# Patient Record
Sex: Female | Born: 1935 | Race: White | Hispanic: No | Smoking: Former smoker
Health system: Southern US, Community
[De-identification: ages and names within clinical notes are randomized; demographics above are authoritative.]

## PROBLEM LIST (undated history)

## (undated) DIAGNOSIS — E079 Disorder of thyroid, unspecified: Secondary | ICD-10-CM

## (undated) DIAGNOSIS — I10 Essential (primary) hypertension: Secondary | ICD-10-CM

## (undated) HISTORY — DX: Essential (primary) hypertension: I10

## (undated) HISTORY — DX: Disorder of thyroid, unspecified: E07.9

## (undated) HISTORY — PX: TOTAL HIP ARTHROPLASTY: SHX124

## (undated) SURGERY — OPEN REDUCTION INTERNAL FIXATION HIP
Anesthesia: Choice | Laterality: Right

---

## 2010-10-18 DIAGNOSIS — Z8781 Personal history of (healed) traumatic fracture: Secondary | ICD-10-CM | POA: Insufficient documentation

## 2011-08-19 ENCOUNTER — Other Ambulatory Visit: Payer: Self-pay | Admitting: Family Medicine

## 2011-09-14 ENCOUNTER — Other Ambulatory Visit: Payer: Self-pay | Admitting: Family Medicine

## 2011-09-23 ENCOUNTER — Other Ambulatory Visit: Payer: Self-pay | Admitting: Physician Assistant

## 2011-10-08 ENCOUNTER — Ambulatory Visit (INDEPENDENT_AMBULATORY_CARE_PROVIDER_SITE_OTHER): Payer: Medicare Other | Admitting: Family Medicine

## 2011-10-08 ENCOUNTER — Encounter: Payer: Self-pay | Admitting: Family Medicine

## 2011-10-08 VITALS — BP 169/94 | HR 79 | Temp 97.6°F | Resp 16 | Ht 65.0 in | Wt 112.4 lb

## 2011-10-08 DIAGNOSIS — R413 Other amnesia: Secondary | ICD-10-CM | POA: Insufficient documentation

## 2011-10-08 DIAGNOSIS — E039 Hypothyroidism, unspecified: Secondary | ICD-10-CM | POA: Insufficient documentation

## 2011-10-08 DIAGNOSIS — R55 Syncope and collapse: Secondary | ICD-10-CM | POA: Diagnosis not present

## 2011-10-08 DIAGNOSIS — E559 Vitamin D deficiency, unspecified: Secondary | ICD-10-CM | POA: Diagnosis not present

## 2011-10-08 DIAGNOSIS — I951 Orthostatic hypotension: Secondary | ICD-10-CM

## 2011-10-08 DIAGNOSIS — I10 Essential (primary) hypertension: Secondary | ICD-10-CM | POA: Diagnosis not present

## 2011-10-08 DIAGNOSIS — I779 Disorder of arteries and arterioles, unspecified: Secondary | ICD-10-CM

## 2011-10-08 LAB — CBC
HCT: 39.3 % (ref 36.0–46.0)
Hemoglobin: 12.7 g/dL (ref 12.0–15.0)
MCH: 27.9 pg (ref 26.0–34.0)
MCHC: 32.3 g/dL (ref 30.0–36.0)
MCV: 86.2 fL (ref 78.0–100.0)
Platelets: 368 10*3/uL (ref 150–400)
RBC: 4.56 MIL/uL (ref 3.87–5.11)
RDW: 13.8 % (ref 11.5–15.5)
WBC: 7.4 10*3/uL (ref 4.0–10.5)

## 2011-10-08 LAB — COMPREHENSIVE METABOLIC PANEL
ALT: 8 U/L (ref 0–35)
AST: 18 U/L (ref 0–37)
Albumin: 4.5 g/dL (ref 3.5–5.2)
Alkaline Phosphatase: 94 U/L (ref 39–117)
BUN: 24 mg/dL — ABNORMAL HIGH (ref 6–23)
CO2: 28 mEq/L (ref 19–32)
Calcium: 9.5 mg/dL (ref 8.4–10.5)
Chloride: 98 mEq/L (ref 96–112)
Creat: 0.69 mg/dL (ref 0.50–1.10)
Glucose, Bld: 97 mg/dL (ref 70–99)
Potassium: 4.2 mEq/L (ref 3.5–5.3)
Sodium: 136 mEq/L (ref 135–145)
Total Bilirubin: 0.5 mg/dL (ref 0.3–1.2)
Total Protein: 7.4 g/dL (ref 6.0–8.3)

## 2011-10-08 LAB — VITAMIN B12: Vitamin B-12: 755 pg/mL (ref 211–911)

## 2011-10-08 LAB — TSH: TSH: 0.169 u[IU]/mL — ABNORMAL LOW (ref 0.350–4.500)

## 2011-10-08 LAB — T4, FREE: Free T4: 1.35 ng/dL (ref 0.80–1.80)

## 2011-10-08 MED ORDER — HYDROCHLOROTHIAZIDE 12.5 MG PO CAPS: ORAL_CAPSULE | ORAL | Status: DC

## 2011-10-08 MED ORDER — RIVASTIGMINE 9.5 MG/24HR TD PT24: 1.0000 | MEDICATED_PATCH | Freq: Every day | TRANSDERMAL | Status: DC

## 2011-10-08 MED ORDER — LEVOTHYROXINE SODIUM 88 MCG PO TABS: 88.0000 ug | ORAL_TABLET | Freq: Every day | ORAL | Status: DC

## 2011-10-08 NOTE — Progress Notes (Signed)
78 yo hypertensive woman seen with daughter.  Episode of presyncope one month ago.  She was in grocery store and had to be helped to a chair  Using a cane  O:  Alert, appropriate  BP:  Left arm 115/70  Sitting;  Right arm 140/78 sitting;  Right arm 120/80 standing Heart:  Regular, no murmur Chest:  Clear Neck:  No bruits, no JVD Extremity: no edema  45% carotid stenosis noted 09/17/2010 in [place]  A:  Hypertension with marked orthostatic change, carotid artery stenosis.  I am concerned she has very calcified vessels and with small changes of medicine or position, dramatic fluctuations of blood pressure may occur.  She is a very pleasant woman, and her daughter is very caring.  Memory loss, stable  P:  Recheck carotids with doppler CMET, TSH, CBC, vitamin D, Vitamin B12   I've asked her to skip every other day of the HCTZ, stop the vitamin D for now.

## 2011-10-08 NOTE — Patient Instructions (Signed)
Take the hydrochlorothiazide every other day Stop the Vitamin D  We will be getting a carotid doppler study to check on hardening of the arteries.

## 2011-10-09 ENCOUNTER — Other Ambulatory Visit: Payer: Self-pay | Admitting: Family Medicine

## 2011-10-09 DIAGNOSIS — E039 Hypothyroidism, unspecified: Secondary | ICD-10-CM

## 2011-10-09 LAB — VITAMIN D 25 HYDROXY (VIT D DEFICIENCY, FRACTURES): Vit D, 25-Hydroxy: 51 ng/mL (ref 30–89)

## 2011-10-09 MED ORDER — LEVOTHYROXINE SODIUM 50 MCG PO TABS: 50.0000 ug | ORAL_TABLET | Freq: Every day | ORAL | Status: DC

## 2011-10-16 ENCOUNTER — Other Ambulatory Visit: Payer: Self-pay | Admitting: Family Medicine

## 2011-10-16 ENCOUNTER — Telehealth: Payer: Self-pay

## 2011-10-16 DIAGNOSIS — R42 Dizziness and giddiness: Secondary | ICD-10-CM

## 2011-10-16 NOTE — Telephone Encounter (Signed)
Hailey Taylor STATES WE WERE SUPPOSE TO REFER HER MOM TO HAVE A TEST DONE AND THEY HAVEN'T HEARD FROM ANYONE. ALSO SHE WOULD LIKE TO SPEAK WITH DR KURT REGARDING HER MOM'S THYROID. INSTEAD OF UPPING HER DOSAGE, WE LOWERED IT AND SHE DOESN'T UNDERSTAND WHY. PLEASE CALL [phone].  SHE HAD CALLED YESTERDAY AND I THOUGHT FOR SURE I PUT THE MESSAGE IN BUT I DON'T SEE IT.

## 2011-10-16 NOTE — Telephone Encounter (Signed)
I ordered it again.

## 2011-10-16 NOTE — Telephone Encounter (Signed)
Dr L, did you want to make a referral for carotid doppler - see OV notes from last visit?

## 2011-10-17 NOTE — Telephone Encounter (Signed)
PT DAUGHTER WANTED TO KNOW WHY WAS HER SYNTHROID DOSE LOWERED INSTEAD OF INCREASED.

## 2011-10-17 NOTE — Progress Notes (Signed)
DAUGTER NOTIFIED

## 2011-10-21 ENCOUNTER — Ambulatory Visit
Admission: RE | Admit: 2011-10-21 | Discharge: 2011-10-21 | Disposition: A | Discharge status: Home / Self Care | Payer: Medicare Other | Source: Ambulatory Visit | Attending: Family Medicine | Admitting: Family Medicine

## 2011-10-21 DIAGNOSIS — R42 Dizziness and giddiness: Secondary | ICD-10-CM

## 2011-10-21 DIAGNOSIS — I6529 Occlusion and stenosis of unspecified carotid artery: Secondary | ICD-10-CM | POA: Diagnosis not present

## 2011-10-21 DIAGNOSIS — E041 Nontoxic single thyroid nodule: Secondary | ICD-10-CM | POA: Diagnosis not present

## 2011-10-22 NOTE — Telephone Encounter (Signed)
Pt's Daughter Daryll Drown states that yesterday while pt was at [facility] imaging her blood pressures were 208/101 on the right arm and 156/86 on the left arm. She also states that her mother did so much better when she was on the HCTZ everyday instead of every other day.

## 2011-10-23 ENCOUNTER — Telehealth: Payer: Self-pay

## 2011-10-23 ENCOUNTER — Other Ambulatory Visit: Payer: Self-pay | Admitting: Family Medicine

## 2011-10-23 DIAGNOSIS — E041 Nontoxic single thyroid nodule: Secondary | ICD-10-CM

## 2011-10-23 NOTE — Telephone Encounter (Signed)
pts daughter calling to ask some questions about her moms last visit if we could call her

## 2011-10-23 NOTE — Telephone Encounter (Signed)
Spoke with patients daughter and she stated that her bp was running high.  Said that Dr. Elbert Ewings put her on every other day of hctz and she thinks she needs it every day. Can you advise on this.  Daughter has put her on hctz once a day.  If you have any concerns then we can call her back.  She will need a refill if she is going to take it every day.

## 2011-10-25 NOTE — Telephone Encounter (Signed)
Agreed.  Take the HCTZ daily and continue to monitor

## 2011-10-26 ENCOUNTER — Other Ambulatory Visit: Payer: Self-pay | Admitting: Family Medicine

## 2011-10-26 MED ORDER — HYDROCHLOROTHIAZIDE 12.5 MG PO CAPS: 12.5000 mg | ORAL_CAPSULE | Freq: Every day | ORAL | Status: DC

## 2011-10-26 NOTE — Telephone Encounter (Signed)
Notified daughter of Dr Cain Saupe agreement for pt to take HCTZ QD, and sent in new Rx for pt. Answered daughter's ?s about TSH test and medication. Advised her to monitor pt's BP and CB if still running high.

## 2011-10-27 ENCOUNTER — Ambulatory Visit
Admission: RE | Admit: 2011-10-27 | Discharge: 2011-10-27 | Disposition: A | Discharge status: Home / Self Care | Payer: Medicare Other | Source: Ambulatory Visit | Attending: Family Medicine | Admitting: Family Medicine

## 2011-10-27 DIAGNOSIS — E041 Nontoxic single thyroid nodule: Secondary | ICD-10-CM

## 2011-10-27 DIAGNOSIS — E042 Nontoxic multinodular goiter: Secondary | ICD-10-CM | POA: Diagnosis not present

## 2011-10-28 ENCOUNTER — Other Ambulatory Visit: Payer: Self-pay | Admitting: Family Medicine

## 2011-10-28 DIAGNOSIS — E041 Nontoxic single thyroid nodule: Secondary | ICD-10-CM

## 2011-10-29 ENCOUNTER — Telehealth: Payer: Self-pay | Admitting: Family Medicine

## 2011-11-03 ENCOUNTER — Other Ambulatory Visit (HOSPITAL_COMMUNITY)
Admission: RE | Admit: 2011-11-03 | Discharge: 2011-11-03 | Disposition: A | Discharge status: Home / Self Care | Payer: Medicare Other | Source: Ambulatory Visit | Attending: Interventional Radiology | Admitting: Interventional Radiology

## 2011-11-03 ENCOUNTER — Ambulatory Visit
Admission: RE | Admit: 2011-11-03 | Discharge: 2011-11-03 | Disposition: A | Discharge status: Home / Self Care | Payer: Medicare Other | Source: Ambulatory Visit | Attending: Family Medicine | Admitting: Family Medicine

## 2011-11-03 DIAGNOSIS — E041 Nontoxic single thyroid nodule: Secondary | ICD-10-CM

## 2011-11-19 ENCOUNTER — Ambulatory Visit (INDEPENDENT_AMBULATORY_CARE_PROVIDER_SITE_OTHER): Payer: Medicare Other | Admitting: Family Medicine

## 2011-11-19 ENCOUNTER — Encounter: Payer: Self-pay | Admitting: Family Medicine

## 2011-11-19 VITALS — BP 187/90 | HR 79 | Temp 97.1°F | Resp 16 | Ht 65.0 in | Wt 115.0 lb

## 2011-11-19 DIAGNOSIS — I6529 Occlusion and stenosis of unspecified carotid artery: Secondary | ICD-10-CM | POA: Diagnosis not present

## 2011-11-19 DIAGNOSIS — E039 Hypothyroidism, unspecified: Secondary | ICD-10-CM | POA: Diagnosis not present

## 2011-11-19 LAB — TSH: TSH: 4.04 u[IU]/mL (ref 0.350–4.500)

## 2011-11-19 NOTE — Progress Notes (Signed)
Symptomatic: Patient seen with her daughter. She's here for followup of hypothyroidism. We had to decrease her Synthroid because her TSH was very low last time. In addition we're here to followup on her carotid artery stenosis and benign biopsy of her thyroid gland. I discussed the results with patient and her daughter at length pointing out that her carotids are not significantly stenosed, her thyroid is actually cancerous areas, and then we will need to keep an eye on the thyroid because of changing requirements.  Ms. Vitale has been getting elevated blood pressure readings at different doctor's offices. Currently taking HCTZ 12.5 mg daily. She's having no dizzy spells despite the spiking blood pressure of over 200 at times.  Objective:  130/84 equal in each arm Heart:  Reg, I/VI SEM Lungs:  Clear Neck:  No bruit heard.  FINAL for MAIA, HANDA ([id number]) Patient Name: Hailey Taylor, Hailey Taylor Accession #: [id number] DOB: 1921-10-29 Age: 78 Gender: F Client Name [facility] Collected Date: 11/03/2011 Received Date: 11/04/2011 Physician: Irish Lack, MD Chart #: MRN # : [id number] Physician cc: Elvina Sidle Race:W Visit #: [id number].[place]-ACM0 CYTOPATHOLOGY REPORT Adequacy Reason Satisfactory For Evaluation. Diagnosis THYROID, FINE NEEDLE ASPIRATION, LEFT: BENIGN. FINDINGS CONSISTENT WITH A BENIGN FOLLICULAR NODULE. Pecola Leisure MD Pathologist, Electronic Signature (Case signed 11/04/2011) Specimen Clinical Information 1.6 x 0.9 x 0.8 cm solid nodule medially mid left lobe thyroid, Bilateral thyroid nodules on carotid ultrasound Source Thyroid, Fine Needle Aspiration, Left Gross Specimen: Received is/are 30cc's of light peach cytolyt and 6 slides in 95% ethyl alcohol. (MW:ph) Prepared: # Smears: 6 # Concentration Technique Slides (i.e. ThinPrep): 1 # Cell Block: Cell block attempted, but not obtained. Additional Studies: N/A Report signed out from the following  location(s) [facility] [address] [place], [place], [place] [address]. CLIA #: [id number],   *RADIOLOGY REPORT*  Clinical Data: Dizziness  BILATERAL CAROTID DUPLEX ULTRASOUND  Technique: Gray scale imaging, color Doppler and duplex ultrasound  was performed of bilateral carotid and vertebral arteries in the  neck.  Comparison: None.  Criteria: Quantification of carotid stenosis is based on velocity  parameters that correlate the residual internal carotid diameter  with NASCET-based stenosis levels, using the diameter of the distal  internal carotid lumen as the denominator for stenosis measurement.  The following velocity measurements were obtained:  PEAK SYSTOLIC/END DIASTOLIC  RIGHT  ICA: 85cm/sec  CCA: 87cm/sec  SYSTOLIC ICA/CCA RATIO: 0.98  DIASTOLIC ICA/CCA RATIO:  ECA: 02VO/ZDG  LEFT  ICA: 69cm/sec  CCA: 96cm/sec  SYSTOLIC ICA/CCA RATIO: 0.72  DIASTOLIC ICA/CCA RATIO:  ECA: 64QI/HKV  Findings: 1.5 cm right thyroid nodule. Left brachial pressure is  156 mmHg and that of the right is 208 mmHg.  RIGHT CAROTID ARTERY: Moderate calcified plaque in the bulb.  Normal-appearing low resistance internal carotid Doppler wave form.  RIGHT VERTEBRAL ARTERY: Antegrade.  LEFT CAROTID ARTERY: Mild calcified plaque in the left bulb. Low  resistance internal carotid Doppler wave form.  LEFT VERTEBRAL ARTERY: Antegrade.  IMPRESSION:  Less than 50% stenosis within the right and left internal carotid  arteries.  Right thyroid nodule as described. Dedicated thyroid ultrasound is  recommended  Discrepancy in the upper extremity blood pressures as described.  Left upper extremity arterial occlusive disease may be present.  Upper extremity arterial Doppler study may be helpful.  Original Report Authenticated By: Donavan Burnet, M.D.     Assessment: Patient has labile hypertension with normal blood pressures when seated in quiet but high blood pressure spiking when  she gets  agitated or starts moving. Her thyroid seems to be becoming more in line although TSH is pending today to be sure. Finally her carotid artery stenosis is mild and not requiring specific remedy.  Plan: Check TSH today. If normal, followup in 6 months. I expect that she'll be stable on 50 mcg of Synthroid daily she is now taking. Continued HCTZ 12.5 daily with the understanding that blood pressure readings will be high when she moves or gets agitated Several copies of all the ports were made and given to patient, daughter.

## 2011-11-19 NOTE — Patient Instructions (Signed)
FINAL for RAMESHA, POSTER ([id number]) Patient Name: Hailey Taylor, CAFFEY Accession #: [id number] DOB: Jan 11, 1922 Age: 78 Gender: F Client Name [facility] Collected Date: 11/03/2011 Received Date: 11/04/2011 Physician: Irish Lack, MD Chart #: MRN # : [id number] Physician cc: Elvina Sidle Race:W Visit #: [id number].[place]-ACM0 CYTOPATHOLOGY REPORT Adequacy Reason Satisfactory For Evaluation. Diagnosis THYROID, FINE NEEDLE ASPIRATION, LEFT: BENIGN. FINDINGS CONSISTENT WITH A BENIGN FOLLICULAR NODULE. Pecola Leisure MD Pathologist, Electronic Signature (Case signed 11/04/2011) Specimen Clinical Information 1.6 x 0.9 x 0.8 cm solid nodule medially mid left lobe thyroid, Bilateral thyroid nodules on carotid ultrasound Source Thyroid, Fine Needle Aspiration, Left Gross Specimen: Received is/are 30cc's of light peach cytolyt and 6 slides in 95% ethyl alcohol. (MW:ph) Prepared: # Smears: 6 # Concentration Technique Slides (i.e. ThinPrep): 1 # Cell Block: Cell block attempted, but not obtained. Additional Studies: N/A Report signed out from the following location(s) [facility] [address] [place], [place], [place] [address]. CLIA #: [id number],   *RADIOLOGY REPORT*  Clinical Data: Dizziness  BILATERAL CAROTID DUPLEX ULTRASOUND  Technique: Gray scale imaging, color Doppler and duplex ultrasound  was performed of bilateral carotid and vertebral arteries in the  neck.  Comparison: None.  Criteria: Quantification of carotid stenosis is based on velocity  parameters that correlate the residual internal carotid diameter  with NASCET-based stenosis levels, using the diameter of the distal  internal carotid lumen as the denominator for stenosis measurement.  The following velocity measurements were obtained:  PEAK SYSTOLIC/END DIASTOLIC  RIGHT  ICA: 85cm/sec  CCA: 87cm/sec  SYSTOLIC ICA/CCA RATIO: 0.98  DIASTOLIC ICA/CCA RATIO:  ECA: 09NA/TFT  LEFT  ICA: 69cm/sec    CCA: 96cm/sec  SYSTOLIC ICA/CCA RATIO: 0.72  DIASTOLIC ICA/CCA RATIO:  ECA: 73UK/GUR  Findings: 1.5 cm right thyroid nodule. Left brachial pressure is  156 mmHg and that of the right is 208 mmHg.  RIGHT CAROTID ARTERY: Moderate calcified plaque in the bulb.  Normal-appearing low resistance internal carotid Doppler wave form.  RIGHT VERTEBRAL ARTERY: Antegrade.  LEFT CAROTID ARTERY: Mild calcified plaque in the left bulb. Low  resistance internal carotid Doppler wave form.  LEFT VERTEBRAL ARTERY: Antegrade.  IMPRESSION:  Less than 50% stenosis within the right and left internal carotid  arteries.  Right thyroid nodule as described. Dedicated thyroid ultrasound is  recommended  Discrepancy in the upper extremity blood pressures as described.  Left upper extremity arterial occlusive disease may be present.  Upper extremity arterial Doppler study may be helpful.  Original Report Authenticated By: Donavan Burnet, M.D.

## 2012-02-25 ENCOUNTER — Other Ambulatory Visit: Payer: Self-pay | Admitting: Family Medicine

## 2012-04-07 ENCOUNTER — Ambulatory Visit: Payer: Medicare Other | Admitting: Family Medicine

## 2012-04-11 ENCOUNTER — Telehealth: Payer: Self-pay

## 2012-04-11 NOTE — Telephone Encounter (Signed)
Can we get her a HC placard?

## 2012-04-11 NOTE — Telephone Encounter (Signed)
Dr. Milus Glazier - patient is 78 years old and daughter is calling to see if they can get her a handicap placard without bringing her in to the office due to her age and the fact that they do not want her exposed to germs. Please call daughter at [phone] Lillia Pauls) The card they have now expires Oct. 31. It was not issued in our office.

## 2012-04-11 NOTE — Telephone Encounter (Signed)
Will forward to the treating physician as the problems in the problem list do not qualify for handicap placard so perhaps he knows the patient better and can handle this for them.

## 2012-04-13 ENCOUNTER — Telehealth: Payer: Self-pay

## 2012-04-13 NOTE — Telephone Encounter (Signed)
Patient may have handicap sticker for rest of her life.  Please put the application in my box to sign

## 2012-04-13 NOTE — Telephone Encounter (Signed)
Form is completed and let daughter know it is ready for p/u[

## 2012-04-13 NOTE — Telephone Encounter (Signed)
Form is in Dr Cain Saupe box to be completed and signed.

## 2012-05-19 ENCOUNTER — Ambulatory Visit (INDEPENDENT_AMBULATORY_CARE_PROVIDER_SITE_OTHER): Payer: Medicare Other | Admitting: Family Medicine

## 2012-05-19 ENCOUNTER — Encounter: Payer: Self-pay | Admitting: Family Medicine

## 2012-05-19 VITALS — BP 112/74 | HR 75 | Temp 97.7°F | Resp 16 | Ht 65.0 in | Wt 117.7 lb

## 2012-05-19 DIAGNOSIS — R413 Other amnesia: Secondary | ICD-10-CM

## 2012-05-19 DIAGNOSIS — E039 Hypothyroidism, unspecified: Secondary | ICD-10-CM | POA: Diagnosis not present

## 2012-05-19 DIAGNOSIS — I10 Essential (primary) hypertension: Secondary | ICD-10-CM | POA: Diagnosis not present

## 2012-05-19 DIAGNOSIS — Z8781 Personal history of (healed) traumatic fracture: Secondary | ICD-10-CM

## 2012-05-19 DIAGNOSIS — R6889 Other general symptoms and signs: Secondary | ICD-10-CM | POA: Diagnosis not present

## 2012-05-19 MED ORDER — RIVASTIGMINE 9.5 MG/24HR TD PT24: 1.0000 | MEDICATED_PATCH | Freq: Every day | TRANSDERMAL | Status: DC

## 2012-05-19 MED ORDER — LEVOTHYROXINE SODIUM 50 MCG PO TABS: 50.0000 ug | ORAL_TABLET | Freq: Every day | ORAL | Status: DC

## 2012-05-19 MED ORDER — HYDROCHLOROTHIAZIDE 12.5 MG PO CAPS: 12.5000 mg | ORAL_CAPSULE | ORAL | Status: DC

## 2012-05-19 NOTE — Patient Instructions (Signed)
130/60 right arm

## 2012-05-19 NOTE — Progress Notes (Signed)
78 yo with hypertension, early Alzheimer's, and hypothyroidism. No falls No GI sx  Objective:  NAD, cheerful 130/60 right arm Moving 4 extremities symmetrically No bruit heard either carotid Chest:  Clear Heart:  Regular, no murmur, no gallop, no rub Abdomen:  Attends in place, soft nontender, no HSM  Using 4 point cane  Assessment: stable  Plan: continue current meds Recheck 1 year unless problems arise 1. History of fracture of hip    2. Hypothyroid  levothyroxine (SYNTHROID, LEVOTHROID) 50 MCG tablet  3. Hypertension  hydrochlorothiazide (MICROZIDE) 12.5 MG capsule  4. Memory loss  rivastigmine (EXELON) 9.5 mg/24hr

## 2012-05-21 ENCOUNTER — Other Ambulatory Visit: Payer: Self-pay | Admitting: Family Medicine

## 2012-10-17 ENCOUNTER — Other Ambulatory Visit: Payer: Self-pay | Admitting: Family Medicine

## 2012-10-17 ENCOUNTER — Telehealth: Payer: Self-pay

## 2012-10-17 DIAGNOSIS — R413 Other amnesia: Secondary | ICD-10-CM

## 2012-10-17 MED ORDER — RIVASTIGMINE 9.5 MG/24HR TD PT24: 1.0000 | MEDICATED_PATCH | Freq: Every day | TRANSDERMAL | Status: DC

## 2012-10-17 NOTE — Telephone Encounter (Signed)
Patches have been called in

## 2012-10-17 NOTE — Telephone Encounter (Signed)
[name] STATES SHE WAS TOLD HER MOM DIDN'T NEED TO COME BACK IN FOR ANOTHER YEAR, BUT HER EXALON 9.5 MGS PATCHES WERE DENIED BECAUSE SHE NEEDED AN OV AND THEY DON'T UNDERSTAND WHY. PLEASE CALL [phone]   [facility] [address]

## 2012-10-18 NOTE — Telephone Encounter (Signed)
Left message rx sent in. Any questions call.

## 2012-10-25 ENCOUNTER — Other Ambulatory Visit: Payer: Self-pay | Admitting: Family Medicine

## 2013-05-10 ENCOUNTER — Ambulatory Visit (INDEPENDENT_AMBULATORY_CARE_PROVIDER_SITE_OTHER): Payer: Medicare Other | Admitting: Family Medicine

## 2013-05-10 ENCOUNTER — Encounter: Payer: Self-pay | Admitting: Family Medicine

## 2013-05-10 ENCOUNTER — Ambulatory Visit: Payer: Medicare Other | Admitting: Family Medicine

## 2013-05-10 VITALS — BP 104/86 | HR 78 | Temp 97.7°F | Resp 16 | Ht 65.0 in | Wt 113.6 lb

## 2013-05-10 DIAGNOSIS — E039 Hypothyroidism, unspecified: Secondary | ICD-10-CM | POA: Diagnosis not present

## 2013-05-10 DIAGNOSIS — R413 Other amnesia: Secondary | ICD-10-CM | POA: Diagnosis not present

## 2013-05-10 DIAGNOSIS — I10 Essential (primary) hypertension: Secondary | ICD-10-CM

## 2013-05-10 LAB — COMPREHENSIVE METABOLIC PANEL
ALT: 10 U/L (ref 0–35)
AST: 17 U/L (ref 0–37)
Albumin: 4.3 g/dL (ref 3.5–5.2)
Alkaline Phosphatase: 74 U/L (ref 39–117)
BUN: 25 mg/dL — ABNORMAL HIGH (ref 6–23)
CO2: 26 mEq/L (ref 19–32)
Calcium: 10.1 mg/dL (ref 8.4–10.5)
Chloride: 98 mEq/L (ref 96–112)
Creat: 0.75 mg/dL (ref 0.50–1.10)
Glucose, Bld: 94 mg/dL (ref 70–99)
Potassium: 4.1 mEq/L (ref 3.5–5.3)
Sodium: 136 mEq/L (ref 135–145)
Total Bilirubin: 0.6 mg/dL (ref 0.3–1.2)
Total Protein: 7.9 g/dL (ref 6.0–8.3)

## 2013-05-10 LAB — CBC
HCT: 36.9 % (ref 36.0–46.0)
Hemoglobin: 12.6 g/dL (ref 12.0–15.0)
MCH: 27.9 pg (ref 26.0–34.0)
MCHC: 34.1 g/dL (ref 30.0–36.0)
MCV: 81.8 fL (ref 78.0–100.0)
Platelets: 368 10*3/uL (ref 150–400)
RBC: 4.51 MIL/uL (ref 3.87–5.11)
RDW: 15.4 % (ref 11.5–15.5)
WBC: 7.3 10*3/uL (ref 4.0–10.5)

## 2013-05-10 MED ORDER — LEVOTHYROXINE SODIUM 50 MCG PO TABS: 50.0000 ug | ORAL_TABLET | Freq: Every day | ORAL | Status: DC

## 2013-05-10 MED ORDER — HYDROCHLOROTHIAZIDE 12.5 MG PO CAPS: 12.5000 mg | ORAL_CAPSULE | ORAL | Status: DC

## 2013-05-10 MED ORDER — RIVASTIGMINE 9.5 MG/24HR TD PT24: 9.5000 mg | MEDICATED_PATCH | Freq: Every day | TRANSDERMAL | Status: DC

## 2013-05-10 NOTE — Patient Instructions (Signed)
Repeat blood pressure on right arm:  138/74

## 2013-05-10 NOTE — Progress Notes (Signed)
79 yo woman who lives with daughter.  Patient is having some loose stools for the past several weeks.  She does take supplemental potassium some days.  She has well rounded diet  Objective:  NAD, 138/74 right arm Very conversant and appropriate. Chest: clear Mental status:  "Wednesday", "November 25th", "Dr. Milus Glazier" Heart:  Reg, no murmur Neck:  Supple. No bruit  Assessment:  Stable.  No real change from last year  Plan:

## 2013-05-11 LAB — TSH: TSH: 2.918 u[IU]/mL (ref 0.350–4.500)

## 2013-05-11 MED ORDER — LEVOTHYROXINE SODIUM 50 MCG PO TABS: 50.0000 ug | ORAL_TABLET | Freq: Every day | ORAL | Status: DC

## 2013-05-11 NOTE — Addendum Note (Signed)
Addended by: Elvina Sidle on: 05/11/2013 08:03 AM   Modules accepted: Orders

## 2013-09-07 ENCOUNTER — Other Ambulatory Visit: Payer: Self-pay | Admitting: Family Medicine

## 2013-12-24 ENCOUNTER — Other Ambulatory Visit: Payer: Self-pay | Admitting: Family Medicine

## 2014-04-27 ENCOUNTER — Other Ambulatory Visit: Payer: Self-pay | Admitting: Family Medicine

## 2014-05-24 ENCOUNTER — Ambulatory Visit: Payer: Medicare Other | Admitting: Family Medicine

## 2014-06-01 ENCOUNTER — Other Ambulatory Visit: Payer: Self-pay | Admitting: Family Medicine

## 2014-06-02 ENCOUNTER — Other Ambulatory Visit: Payer: Self-pay | Admitting: Family Medicine

## 2014-06-14 ENCOUNTER — Ambulatory Visit: Payer: Medicare Other | Admitting: Family Medicine

## 2014-06-28 ENCOUNTER — Encounter: Payer: Self-pay | Admitting: Family Medicine

## 2014-06-28 ENCOUNTER — Ambulatory Visit (INDEPENDENT_AMBULATORY_CARE_PROVIDER_SITE_OTHER): Payer: Medicare Other | Admitting: Family Medicine

## 2014-06-28 VITALS — BP 196/99 | HR 71 | Temp 97.6°F | Resp 16 | Ht 65.0 in | Wt 112.0 lb

## 2014-06-28 DIAGNOSIS — E039 Hypothyroidism, unspecified: Secondary | ICD-10-CM

## 2014-06-28 DIAGNOSIS — I10 Essential (primary) hypertension: Secondary | ICD-10-CM

## 2014-06-28 DIAGNOSIS — R413 Other amnesia: Secondary | ICD-10-CM

## 2014-06-28 DIAGNOSIS — H6122 Impacted cerumen, left ear: Secondary | ICD-10-CM

## 2014-06-28 MED ORDER — LEVOTHYROXINE SODIUM 50 MCG PO TABS: ORAL_TABLET | ORAL | Status: DC

## 2014-06-28 MED ORDER — LISINOPRIL-HYDROCHLOROTHIAZIDE 20-12.5 MG PO TABS: 1.0000 | ORAL_TABLET | Freq: Every day | ORAL | Status: DC

## 2014-06-28 MED ORDER — RIVASTIGMINE 9.5 MG/24HR TD PT24: 9.5000 mg | MEDICATED_PATCH | Freq: Every day | TRANSDERMAL | Status: DC

## 2014-06-28 NOTE — Progress Notes (Signed)
Subjective:  This chart was scribed for Elvina Sidle, MD by Carl Best, Medical Scribe. This patient was seen in Room 27 and the patient's care was started at 11:41 AM.   Patient ID: Hailey Taylor, female    DOB: 1922-06-13, 81 y.o.   MRN: [id number]  HPI HPI Comments: Hailey Taylor is a 81 y.o. female who presents to the [facility] for a follow-up visit.  She is feeling well but upon exam, her blood pressure on her right arm was significantly higher than the left.  She is no longer experiencing diarrhea.  She drinks a Probiotic drink every morning with relief to her symptoms.  She has some difficulty with her memory.  She has not fallen since her last visit.  She does not get flu vaccines.     Patient Active Problem List   Diagnosis Date Noted  . Hypertension 10/08/2011  . Memory loss 10/08/2011  . Hypothyroidism 10/08/2011  . History of fracture of hip 10/18/2010   No past medical history on file. Past Surgical History  Procedure Laterality Date  . Total hip arthroplasty     Not on File Prior to Admission medications   Medication Sig Start Date End Date Taking? Authorizing Provider  EXELON 9.5 MG/24HR PLACE 1 PATCH (9.5 MG TOTAL) ONTO THE SKIN DAILY. NEEDS OFFICE VISIT FOR MORE    Morrell Riddle, PA-C  GARLIC PO Take by mouth daily.    Historical Provider, MD  hydrochlorothiazide (MICROZIDE) 12.5 MG capsule TAKE 1 CAPSULE BY MOUTH EVERY MORNING. 04/28/14   Chelle S Jeffery, PA-C  levothyroxine (SYNTHROID, LEVOTHROID) 50 MCG tablet TAKE 1 TABLET BY MOUTH DAILY BEFORE BREAKFAST 06/01/14   Morrell Riddle, PA-C  OVER THE COUNTER MEDICATION Vit D3 2000iu    Historical Provider, MD  rivastigmine (EXELON) 9.5 mg/24hr Place 1 patch (9.5 mg total) onto the skin daily. PATIENT NEEDS OFFICE VISIT FOR ADDITIONAL REFILLS 06/04/14   Elvina Sidle, MD   History   Social History  . Marital Status: Divorced    Spouse Name: N/A    Number of Children: N/A  . Years of  Education: N/A   Occupational History  . Not on file.   Social History Main Topics  . Smoking status: Former Games developer  . Smokeless tobacco: Not on file  . Alcohol Use: Yes  . Drug Use: No  . Sexual Activity: Not on file   Other Topics Concern  . Not on file   Social History Narrative  . No narrative on file     Review of Systems  Gastrointestinal: Negative for abdominal pain and diarrhea.  All other systems reviewed and are negative.    Objective:  Physical Exam  Constitutional: She is oriented to person, place, and time. She appears well-developed and well-nourished.  HENT:  Head: Normocephalic and atraumatic.  Right Ear: External ear normal.  Left Ear: External ear normal.  Mouth/Throat: Oropharynx is clear and moist. No oropharyngeal exudate.  Eyes: EOM are normal.  Neck: Normal range of motion. Carotid bruit is not present.  Cardiovascular: Normal rate and regular rhythm.  Exam reveals no gallop and no friction rub.   Murmur heard.  Systolic murmur is present with a grade of 1/6  Pulmonary/Chest: Effort normal and breath sounds normal. No respiratory distress. She has no wheezes. She has no rales.  Abdominal: Soft. Bowel sounds are normal. She exhibits no distension and no mass. There is no tenderness. There is no rebound and  no guarding.  Musculoskeletal: Normal range of motion.  Neurological: She is alert and oriented to person, place, and time.  Unsteady gait and requires the use of a cane.    Skin: Skin is warm and dry.  Psychiatric: She has a normal mood and affect. Her behavior is normal.  Confused about time of year.    Nursing note and vitals reviewed. left ear lavaged clear   BP 196/99 mmHg  Pulse 71  Temp(Src) 97.6 F (36.4 C)  Resp 16  Ht 5\' 5"  (1.651 m)  Wt 112 lb (50.803 kg)  BMI 18.64 kg/m2  SpO2 98% Assessment & Plan:    This chart was scribed in my presence and reviewed by me personally.    ICD-9-CM ICD-10-CM   1. Hypothyroidism,  unspecified hypothyroidism type 244.9 E03.9 TSH     levothyroxine (SYNTHROID, LEVOTHROID) 50 MCG tablet  2. Essential hypertension, benign 401.1 I10 COMPLETE METABOLIC PANEL WITH GFR     lisinopril-hydrochlorothiazide (ZESTORETIC) 20-12.5 MG per tablet  3. Cerumen impaction, left 380.4 H61.22   4. Memory loss 780.93 R41.3 rivastigmine (EXELON) 9.5 mg/24hr     Signed, Elvina SidleKurt Carlye Panameno, MD

## 2014-06-29 LAB — COMPLETE METABOLIC PANEL WITH GFR
ALT: 8 U/L (ref 0–35)
AST: 16 U/L (ref 0–37)
Albumin: 3.9 g/dL (ref 3.5–5.2)
Alkaline Phosphatase: 79 U/L (ref 39–117)
BUN: 24 mg/dL — ABNORMAL HIGH (ref 6–23)
CO2: 29 mEq/L (ref 19–32)
Calcium: 9.5 mg/dL (ref 8.4–10.5)
Chloride: 98 mEq/L (ref 96–112)
Creat: 0.71 mg/dL (ref 0.50–1.10)
GFR, Est African American: 87 mL/min
GFR, Est Non African American: 75 mL/min
Glucose, Bld: 76 mg/dL (ref 70–99)
Potassium: 3.9 mEq/L (ref 3.5–5.3)
Sodium: 137 mEq/L (ref 135–145)
Total Bilirubin: 0.5 mg/dL (ref 0.2–1.2)
Total Protein: 7.2 g/dL (ref 6.0–8.3)

## 2014-06-29 LAB — TSH: TSH: 3.548 u[IU]/mL (ref 0.350–4.500)

## 2014-07-21 ENCOUNTER — Other Ambulatory Visit: Payer: Self-pay | Admitting: Physician Assistant

## 2014-08-28 ENCOUNTER — Other Ambulatory Visit: Payer: Self-pay | Admitting: Physician Assistant

## 2014-11-24 ENCOUNTER — Other Ambulatory Visit: Payer: Self-pay | Admitting: Family Medicine

## 2014-12-10 ENCOUNTER — Other Ambulatory Visit: Payer: Self-pay

## 2014-12-20 ENCOUNTER — Telehealth: Payer: Self-pay

## 2014-12-20 NOTE — Telephone Encounter (Signed)
Patient's daughter is calling because the patients needs a handicap sticker. She will also be dropping paperwork that needs to be filled. [name]'s phone: [phone]

## 2014-12-21 NOTE — Telephone Encounter (Signed)
Patient's daughter Darel HongJudy dropped off form and it has been placed in the box at the nurse's station. She doesn't not have a valid license but will be getting an Rosa Sanchez ID soon.

## 2014-12-25 NOTE — Telephone Encounter (Signed)
Form filled out. Ready for pick up.

## 2014-12-25 NOTE — Telephone Encounter (Signed)
Called daughter to let her know, forms are ready to pick up.

## 2014-12-25 NOTE — Telephone Encounter (Signed)
In Dr. Cain SaupeL's box if someone wanted to help with this.

## 2015-02-15 ENCOUNTER — Telehealth: Payer: Self-pay | Admitting: Family Medicine

## 2015-02-15 NOTE — Telephone Encounter (Signed)
lmom to call and reschedule her appt that she had with Dr Patsy Lager in December

## 2015-03-20 ENCOUNTER — Ambulatory Visit: Payer: Medicare Other | Admitting: Family Medicine

## 2015-04-04 ENCOUNTER — Telehealth: Payer: Self-pay | Admitting: Family Medicine

## 2015-04-04 NOTE — Telephone Encounter (Signed)
Patients daughter returned call. She has appointment with Dr. Patsy Lageropland on 11/30 at 1130

## 2015-04-04 NOTE — Telephone Encounter (Signed)
LEFT A MESSAGE FOR PATIENT TO CALL AND SCHEDULE A VISIT FOR PNEUMONIA VACCINE AND BMI SCREENING AND FOLLOW UP PLAN

## 2015-05-15 ENCOUNTER — Encounter: Payer: Self-pay | Admitting: Family Medicine

## 2015-05-15 ENCOUNTER — Ambulatory Visit (INDEPENDENT_AMBULATORY_CARE_PROVIDER_SITE_OTHER): Payer: Medicare Other | Admitting: Family Medicine

## 2015-05-15 VITALS — BP 106/70 | HR 64 | Temp 97.8°F | Resp 16 | Ht 65.0 in | Wt 100.6 lb

## 2015-05-15 DIAGNOSIS — E034 Atrophy of thyroid (acquired): Secondary | ICD-10-CM

## 2015-05-15 DIAGNOSIS — R748 Abnormal levels of other serum enzymes: Secondary | ICD-10-CM

## 2015-05-15 DIAGNOSIS — E038 Other specified hypothyroidism: Secondary | ICD-10-CM

## 2015-05-15 DIAGNOSIS — I10 Essential (primary) hypertension: Secondary | ICD-10-CM

## 2015-05-15 DIAGNOSIS — R54 Age-related physical debility: Secondary | ICD-10-CM | POA: Diagnosis not present

## 2015-05-15 DIAGNOSIS — R7989 Other specified abnormal findings of blood chemistry: Secondary | ICD-10-CM

## 2015-05-15 LAB — BASIC METABOLIC PANEL
BUN: 47 mg/dL — ABNORMAL HIGH (ref 7–25)
CO2: 24 mmol/L (ref 20–31)
Calcium: 9.3 mg/dL (ref 8.6–10.4)
Chloride: 101 mmol/L (ref 98–110)
Creat: 1.23 mg/dL — ABNORMAL HIGH (ref 0.60–0.88)
GLUCOSE: 78 mg/dL (ref 65–99)
POTASSIUM: 4.7 mmol/L (ref 3.5–5.3)
SODIUM: 134 mmol/L — AB (ref 135–146)

## 2015-05-15 LAB — TSH: TSH: 0.75 u[IU]/mL (ref 0.350–4.500)

## 2015-05-15 MED ORDER — LISINOPRIL-HYDROCHLOROTHIAZIDE 10-12.5 MG PO TABS: 1.0000 | ORAL_TABLET | Freq: Every day | ORAL | Status: DC

## 2015-05-15 MED ORDER — LEVOTHYROXINE SODIUM 50 MCG PO TABS: ORAL_TABLET | ORAL | Status: DC

## 2015-05-15 NOTE — Patient Instructions (Addendum)
Wait until I contact you to fill the thyroid med in case we need to change your dose We decreased your BP medication dose from 20/12.5 to 10/12.5 If your home readings are getting higher than 140/90 let me know  You have dropped some weight- try to add in some higher calorie foods such as ensure, ice cream, milk shakes  You might see Dr. Clelia CroftShaw [facility]  I am also glad to see you at my new office

## 2015-05-15 NOTE — Progress Notes (Addendum)
[facility] [address] [place] [address] [id number] [phone]  Date:  05/15/2015   Name:  Hailey Taylor   DOB:  12/07/1921   MRN:  [id number]  PCP:  No primary care provider on file.    Chief Complaint: Follow-up; Hypertension; and Hypothyroidism   History of Present Illness:  Hailey Taylor is a 81 y.o. very pleasant female patient who presents with the following:  Here today for a follow-up visit.  She had planned to see me now that Dr. Elbert Ewings is retiring- unfortunately I am also leaving [facility] in a few months Needs recheck of her thyroid and BP  Last TSH check in January.   She has been on thyroid medication for several years They added lisinpril to her BP regimen back in January when her BP was quite high.  She fell a couple of times after increasing her BP medication- they changed to using this at night and the falls stopped.  They do not really check her BP at home although they do have a cuff  Also her daughter has noted some weight loss.  Hailey Taylor does eat regularly but does not have a huge appetite.  She suffers from mild alzheimer's disease.  Her daughter Hailey Taylor understands that weight loss is likely part of her aging and chronic disease process and does not wish to pursue any particular testing at this time  Wt Readings from Last 3 Encounters:  05/15/15 100 lb 9.6 oz (45.632 kg)  06/28/14 112 lb (50.803 kg)  05/10/13 113 lb 9.6 oz (51.529 kg)   BP Readings from Last 3 Encounters:  05/15/15 106/70  06/28/14 196/99  05/10/13 104/86      Patient Active Problem List   Diagnosis Date Noted  . Hypertension 10/08/2011  . Memory loss 10/08/2011  . Hypothyroidism 10/08/2011  . History of fracture of hip 10/18/2010    No past medical history on file.  Past Surgical History  Procedure Laterality Date  . Total hip arthroplasty      Social History  Substance Use Topics  . Smoking status: Former Games developer  . Smokeless tobacco: None  . Alcohol Use: Yes     No family history on file.  No Known Allergies  Medication list has been reviewed and updated.  Current Outpatient Prescriptions on File Prior to Visit  Medication Sig Dispense Refill  . levothyroxine (SYNTHROID, LEVOTHROID) 50 MCG tablet TAKE 1 TABLET BY MOUTH DAILY BEFORE BREAKFAST 90 tablet 0  . levothyroxine (SYNTHROID, LEVOTHROID) 50 MCG tablet TAKE 1 TABLET BY MOUTH DAILY BEFORE BREAKFAST 90 tablet 0  . lisinopril-hydrochlorothiazide (ZESTORETIC) 20-12.5 MG per tablet Take 1 tablet by mouth daily. 90 tablet 3  . rivastigmine (EXELON) 9.5 mg/24hr Place 1 patch (9.5 mg total) onto the skin daily. 90 patch 3  . GARLIC PO Take by mouth daily.    Marland Kitchen OVER THE COUNTER MEDICATION Vit D3 2000iu     No current facility-administered medications on file prior to visit.    Review of Systems:  As per HPI- otherwise negative.   Physical Examination: Filed Vitals:   05/15/15 1136  BP: 106/70  Pulse: 64  Temp: 97.8 F (36.6 C)  Resp: 16   Filed Vitals:   05/15/15 1136  Height:  (1.651 m)  Weight: 100 lb 9.6 oz (45.632 kg)   Body mass index is 16.74 kg/(m^2). Ideal Body Weight: Weight in (lb) to have BMI = 25: 149.9  GEN: WDWN, NAD, Non-toxic, A &  O x 3, elderly, very thin lady who appears to be very well kept and cared for.  Nicely dressed and wearing make-up HEENT: Atraumatic, Normocephalic. Neck supple. No masses, No LAD. Ears and Nose: No external deformity. CV: RRR, No M/G/R. No JVD. No thrill. No extra heart sounds. PULM: CTA B, no wheezes, crackles, rhonchi. No retractions. No resp. distress. No accessory muscle use. EXTR: No c/c/e NEURO Normal gait. Does use a cane for balance PSYCH: Normally interactive. Conversant. Not depressed or anxious appearing.  Calm demeanor.    Assessment and Plan: Essential hypertension - Plan: Basic metabolic panel, lisinopril-hydrochlorothiazide (PRINZIDE,ZESTORETIC) 10-12.5 MG tablet  Hypothyroidism due to acquired atrophy of  thyroid - Plan: TSH, levothyroxine (SYNTHROID, LEVOTHROID) 50 MCG tablet  Essential hypertension, benign  Will decrease her BP medication to lisinopril 10/hctz 12.5 as her BP is on the low side and she has had some falls They will monitor her BP and home and keep me posted Check TSH, refilled synthroid Follow-up in 6 months   Signed Abbe AmsterdamJessica Copland, MD  12/1- will call Hailey HongJudy (her daughter) today and let her now TSH is ok, but we do need to recheck her creat in 2-3 weeks.  ?low due to hypotension.  Placed an order for lab visit only

## 2015-05-16 ENCOUNTER — Encounter: Payer: Self-pay | Admitting: Family Medicine

## 2015-05-16 NOTE — Addendum Note (Signed)
Addended by: Abbe AmsterdamOPLAND, JESSICA C on: 05/16/2015 07:13 AM   Modules accepted: Orders

## 2015-05-20 ENCOUNTER — Ambulatory Visit: Payer: Medicare Other | Admitting: Family Medicine

## 2015-05-22 ENCOUNTER — Ambulatory Visit: Payer: Medicare Other | Admitting: Family Medicine

## 2015-05-27 ENCOUNTER — Telehealth: Payer: Self-pay

## 2015-05-27 NOTE — Telephone Encounter (Signed)
Simon RheinGave [name] a call- we will plan to recheck her BMP next month

## 2015-05-27 NOTE — Telephone Encounter (Signed)
Pt daughter is needing to talk with dr copland about pt lab work    [employer] number [phone]

## 2015-05-30 ENCOUNTER — Encounter: Payer: Medicare Other | Admitting: Family Medicine

## 2015-06-01 ENCOUNTER — Other Ambulatory Visit: Payer: Self-pay | Admitting: Family Medicine

## 2015-06-15 ENCOUNTER — Other Ambulatory Visit: Payer: Self-pay | Admitting: Family Medicine

## 2015-07-03 ENCOUNTER — Other Ambulatory Visit: Payer: Self-pay | Admitting: Family Medicine

## 2015-07-03 ENCOUNTER — Other Ambulatory Visit (INDEPENDENT_AMBULATORY_CARE_PROVIDER_SITE_OTHER): Payer: Medicare Other

## 2015-07-03 DIAGNOSIS — R748 Abnormal levels of other serum enzymes: Secondary | ICD-10-CM

## 2015-07-03 DIAGNOSIS — R7989 Other specified abnormal findings of blood chemistry: Secondary | ICD-10-CM

## 2015-07-03 LAB — BASIC METABOLIC PANEL
BUN: 34 mg/dL — ABNORMAL HIGH (ref 7–25)
CO2: 27 mmol/L (ref 20–31)
Calcium: 9.9 mg/dL (ref 8.6–10.4)
Chloride: 98 mmol/L (ref 98–110)
Creat: 1.25 mg/dL — ABNORMAL HIGH (ref 0.60–0.88)
GLUCOSE: 72 mg/dL (ref 65–99)
POTASSIUM: 4.5 mmol/L (ref 3.5–5.3)
SODIUM: 135 mmol/L (ref 135–146)

## 2015-07-04 ENCOUNTER — Telehealth: Payer: Self-pay

## 2015-07-04 ENCOUNTER — Encounter: Payer: Self-pay | Admitting: Family Medicine

## 2015-07-04 NOTE — Telephone Encounter (Signed)
Called [name] back and [facility]- I received message that Hailey Taylor was not doing well.  Please bring her in so we can make sure there is no phyiscal cause such as UTI - maybe this Saturday?

## 2015-07-04 NOTE — Telephone Encounter (Signed)
Dr Patsy Lager  Condition worse - talking about things that don't exist.  Daughter has been able to handle up to this point.  Looking for a medication - anti anxiety - stop worrying.   Everything is fine - worry.    [phone]

## 2015-07-10 ENCOUNTER — Other Ambulatory Visit: Payer: Self-pay | Admitting: *Deleted

## 2015-07-10 ENCOUNTER — Ambulatory Visit (INDEPENDENT_AMBULATORY_CARE_PROVIDER_SITE_OTHER): Payer: Medicare Other | Admitting: Family Medicine

## 2015-07-10 VITALS — BP 80/57 | HR 96 | Temp 97.7°F | Resp 16 | Ht 65.0 in | Wt 100.0 lb

## 2015-07-10 DIAGNOSIS — F0280 Dementia in other diseases classified elsewhere without behavioral disturbance: Secondary | ICD-10-CM | POA: Diagnosis not present

## 2015-07-10 DIAGNOSIS — F05 Delirium due to known physiological condition: Principal | ICD-10-CM

## 2015-07-10 DIAGNOSIS — R413 Other amnesia: Secondary | ICD-10-CM | POA: Diagnosis not present

## 2015-07-10 DIAGNOSIS — R410 Disorientation, unspecified: Secondary | ICD-10-CM

## 2015-07-10 DIAGNOSIS — N3000 Acute cystitis without hematuria: Secondary | ICD-10-CM

## 2015-07-10 DIAGNOSIS — G301 Alzheimer's disease with late onset: Secondary | ICD-10-CM | POA: Diagnosis not present

## 2015-07-10 DIAGNOSIS — R350 Frequency of micturition: Secondary | ICD-10-CM

## 2015-07-10 MED ORDER — LISINOPRIL 10 MG PO TABS: 10.0000 mg | ORAL_TABLET | Freq: Every day | ORAL | Status: DC

## 2015-07-10 NOTE — Patient Instructions (Addendum)
We are going to cut down on the blood pressure medication. We will stop HCTZ and use ONLY the lisinopril 10 mg. I will also culture the urine to make sure there is no sign of infection.  If her BP continues to run low we will stop the BP medication entirely  Remember to take care of yourself- it is time to get some more help for you!  This may include taking advantage of day programs for seniors and/ or getting some help at home to provide respite for yourself.    Please check out the website for [facility] senior resources. Also, you can read reviews and otherwise investigate home care agencies online.  [facility] has reviews of many agencies.  [email or web address].[email or web address]-resources-[place].org/ https://www.caring.com/local/[email or web address][place][email or web address][email or web address]

## 2015-07-10 NOTE — Progress Notes (Addendum)
[facility] [address] [place] [address] [phone]  Date:  07/10/2015   Name:  Hailey Taylor   DOB:  Aug 26, 1921   MRN:  [id number]  PCP:  No primary care provider on file.    Chief Complaint: Memory Loss   History of Present Illness:  Hailey Taylor is a 82 y.o. very pleasant female patient who presents with the following:  Here today with her daughter and caregiver Hailey Taylor- she has noticed more confusion as of late.   They brought in a urine sample from last night as I had mentioned possible UTI as the cause of worsening.    Hailey Taylor is a lot more confused over the last several months.  She has been living with her daughter for the last 5 years or so. Her daughter is getting more concerned about her and also about her ability to care for her on her own They have not had any problems with wandering, trying to cook, etc.   No more falls recently   She has been on the exelon patch for several years.  She has been formally dx with Alzhemier's disease by her neurologoist when she was living in [facility] They decline a flu shot today At our last visit we decreased her anti-HTN med dosage   BP Readings from Last 3 Encounters:  07/10/15 80/57  05/15/15 106/70  06/28/14 196/99     Patient Active Problem List   Diagnosis Date Noted  . 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 Hypertension 10/08/2011  . Memory loss 10/08/2011  . Hypothyroidism 10/08/2011  . History of fracture of hip 10/18/2010    No past medical history on file.  Past Surgical History  Procedure Laterality Date  . Total hip arthroplasty      Social History  Substance Use Topics  . Smoking status: Former Games developer  . Smokeless tobacco: Not on file  . Alcohol Use: Yes    No family history on file.  No Known Allergies  Medication list has been reviewed and updated.  Current Outpatient Prescriptions on File Prior to Visit  Medication Sig Dispense Refill  . aspirin 81 MG tablet Take 81 mg by  mouth daily.    Marland Kitchen GARLIC PO Take by mouth daily.    Marland Kitchen levothyroxine (SYNTHROID, LEVOTHROID) 50 MCG tablet TAKE 1 TABLET BY MOUTH DAILY BEFORE BREAKFAST 90 tablet 3  . lisinopril-hydrochlorothiazide (PRINZIDE,ZESTORETIC) 10-12.5 MG tablet Take 1 tablet by mouth daily. 90 tablet 3  . OVER THE COUNTER MEDICATION Vit D3 2000iu    . rivastigmine (EXELON) 9.5 mg/24hr PLACE 1 PATCH (9.5 MG TOTAL) ONTO THE SKIN DAILY 90 patch 0   No current facility-administered medications on file prior to visit.    Review of Systems:  As per HPI- otherwise negative.   Physical Examination: Filed Vitals:   07/10/15 1154 07/10/15 1155  BP: 132/86 80/57  Pulse: 89 96  Temp: 97.7 F (36.5 C)   Resp: 16    Filed Vitals:   07/10/15 1154  Height:  (1.651 m)  Weight: 100 lb (45.36 kg)   Body mass index is 16.64 kg/(m^2). Ideal Body Weight: Weight in (lb) to have BMI = 25: 149.9  GEN: WDWN, NAD, confused, well groomed and dressed HEENT: Atraumatic, Normocephalic. Neck supple. No masses, No LAD. Ears and Nose: No external deformity. CV: RRR, No M/G/R. No JVD. No thrill. No extra heart sounds. PULM: CTA B, no wheezes, crackles, rhonchi. No retractions. No resp. distress.  No accessory muscle use. EXTR: No c/c/e NEURO Normal gait for pt, uses a walker PSYCH: cannot generally contribute to conversation- does make a few sensical comments but mostly says things like "my, my," etc. Not depressed or anxious appearing.  Calm demeanor.    Assessment and Plan: Subacute confusional state - Plan: Urine culture, lisinopril (PRINIVIL,ZESTRIL) 10 MG tablet  Memory loss  Urinary frequency - Plan: Urine culture  Late onset Alzheimer's disease without behavioral disturbance  At this point Hailey Taylor is having more trouble taking care of her mom at home without really any help. Discussed some resources that may be useful for her and encouraged her to ask for help.  She plans to do so Her creat has been a little bit  high, and her BP is on the low side.  Will stop HCTZ Will culture urine to make sure no UTI Encouraged use of routines to aid in memory management Will plan further follow- up pending labs.   Signed Abbe Amsterdam, MD  1/28- received positive urine culture and gave Hailey Taylor a call.  [name] does have a UTI.  Will send in abx now.   Meds ordered this encounter  Medications  . DISCONTD: lisinopril (PRINIVIL,ZESTRIL) 10 MG tablet    Sig: Take 1 tablet (10 mg total) by mouth daily.    Dispense:  30 tablet    Refill:  11  . amoxicillin-clavulanate (AUGMENTIN) 500-125 MG tablet    Sig: Take 1 tablet (500 mg total) by mouth 2 (two) times daily.    Dispense:  14 tablet    Refill:  0  '

## 2015-07-12 LAB — URINE CULTURE

## 2015-07-13 ENCOUNTER — Encounter: Payer: Self-pay | Admitting: Family Medicine

## 2015-07-13 DIAGNOSIS — R7989 Other specified abnormal findings of blood chemistry: Secondary | ICD-10-CM

## 2015-07-13 MED ORDER — AMOXICILLIN-POT CLAVULANATE 500-125 MG PO TABS: 1.0000 | ORAL_TABLET | Freq: Two times a day (BID) | ORAL | Status: DC

## 2015-07-13 NOTE — Addendum Note (Signed)
Addended by: Abbe Amsterdam C on: 07/13/2015 11:55 AM   Modules accepted: Orders

## 2015-09-06 ENCOUNTER — Other Ambulatory Visit (INDEPENDENT_AMBULATORY_CARE_PROVIDER_SITE_OTHER): Payer: Medicare Other | Admitting: *Deleted

## 2015-09-06 DIAGNOSIS — R7989 Other specified abnormal findings of blood chemistry: Secondary | ICD-10-CM | POA: Diagnosis not present

## 2015-09-06 DIAGNOSIS — R748 Abnormal levels of other serum enzymes: Secondary | ICD-10-CM | POA: Diagnosis not present

## 2015-09-07 LAB — BASIC METABOLIC PANEL
BUN: 38 mg/dL — AB (ref 7–25)
CO2: 22 mmol/L (ref 20–31)
Calcium: 9.3 mg/dL (ref 8.6–10.4)
Chloride: 100 mmol/L (ref 98–110)
Creat: 1.26 mg/dL — ABNORMAL HIGH (ref 0.60–0.88)
Glucose, Bld: 76 mg/dL (ref 65–99)
POTASSIUM: 4.8 mmol/L (ref 3.5–5.3)
SODIUM: 136 mmol/L (ref 135–146)

## 2015-10-09 ENCOUNTER — Other Ambulatory Visit: Payer: Self-pay | Admitting: Family Medicine

## 2015-11-07 ENCOUNTER — Encounter: Payer: Self-pay | Admitting: Family Medicine

## 2015-11-07 ENCOUNTER — Ambulatory Visit (INDEPENDENT_AMBULATORY_CARE_PROVIDER_SITE_OTHER): Payer: Medicare Other | Admitting: Family Medicine

## 2015-11-07 VITALS — BP 117/76 | HR 62 | Temp 97.9°F | Resp 20 | Wt 100.5 lb

## 2015-11-07 DIAGNOSIS — E039 Hypothyroidism, unspecified: Secondary | ICD-10-CM

## 2015-11-07 DIAGNOSIS — N289 Disorder of kidney and ureter, unspecified: Secondary | ICD-10-CM | POA: Diagnosis not present

## 2015-11-07 DIAGNOSIS — I10 Essential (primary) hypertension: Secondary | ICD-10-CM

## 2015-11-07 DIAGNOSIS — N9089 Other specified noninflammatory disorders of vulva and perineum: Secondary | ICD-10-CM | POA: Diagnosis not present

## 2015-11-07 DIAGNOSIS — G301 Alzheimer's disease with late onset: Secondary | ICD-10-CM

## 2015-11-07 DIAGNOSIS — F0280 Dementia in other diseases classified elsewhere without behavioral disturbance: Secondary | ICD-10-CM

## 2015-11-07 LAB — POCT URINALYSIS DIP (MANUAL ENTRY)
BILIRUBIN UA: NEGATIVE
GLUCOSE UA: NEGATIVE
Ketones, POC UA: NEGATIVE
Nitrite, UA: POSITIVE — AB
Protein Ur, POC: 30 — AB
SPEC GRAV UA: 1.02
UROBILINOGEN UA: 0.2
pH, UA: 5.5

## 2015-11-07 LAB — POC MICROSCOPIC URINALYSIS (UMFC): Mucus: ABSENT

## 2015-11-07 LAB — BASIC METABOLIC PANEL
BUN: 32 mg/dL — AB (ref 7–25)
CALCIUM: 9.3 mg/dL (ref 8.6–10.4)
CO2: 23 mmol/L (ref 20–31)
Chloride: 102 mmol/L (ref 98–110)
Creat: 1.06 mg/dL — ABNORMAL HIGH (ref 0.60–0.88)
Glucose, Bld: 80 mg/dL (ref 65–99)
POTASSIUM: 4.2 mmol/L (ref 3.5–5.3)
Sodium: 137 mmol/L (ref 135–146)

## 2015-11-07 LAB — TSH: TSH: 1.23 mIU/L

## 2015-11-07 MED ORDER — HYDROCHLOROTHIAZIDE 12.5 MG PO CAPS: 12.5000 mg | ORAL_CAPSULE | Freq: Every day | ORAL | Status: DC

## 2015-11-07 MED ORDER — MUPIROCIN 2 % EX OINT: 1.0000 "application " | TOPICAL_OINTMENT | Freq: Four times a day (QID) | CUTANEOUS | Status: DC

## 2015-11-07 NOTE — Patient Instructions (Addendum)
Meds ordered this encounter  Medications  . Cholecalciferol (VITAMIN D3) 5000 units CAPS    Sig: Take 1 capsule by mouth daily.  . mupirocin ointment (BACTROBAN) 2 %    Sig: Apply 1 application topically 4 (four) times daily.    Dispense:  30 g    Refill:  1  . hydrochlorothiazide (MICROZIDE) 12.5 MG capsule    Sig: Take 1 capsule (12.5 mg total) by mouth daily.    Dispense:  30 capsule    Refill:  1   Stop the lisinopril. Restart the prior hydrochlorothiazide (HCTZ). Ok to continue the potassium supplement for now but be aware that I will likely encourage you to stop this in the fall.  Apply the mupirocin and a non-stick pad to the left inner upper thigh ulcer 2-4 times a day after a wet warm washcloth (consider wrapping the washcloth around a spoon held under the hot water to keep it warm for longer.  This will help that chronic ulcer/wound - likely from an ingrown hair - heal.  If you are worried about any urinary symtoms, you may always drop off a urinary sample at your convenience as long as it has the patient's name and DOB on it  As well as my name "Dr. Clelia CroftShaw" so they know who to send the results to.  Managing Your High Blood Pressure Blood pressure is a measurement of how forceful your blood is pressing against the walls of the arteries. Arteries are muscular tubes within the circulatory system. Blood pressure does not stay the same. Blood pressure rises when you are active, excited, or nervous; and it lowers during sleep and relaxation. If the numbers measuring your blood pressure stay above normal most of the time, you are at risk for health problems. High blood pressure (hypertension) is a long-term (chronic) condition in which blood pressure is elevated. A blood pressure reading is recorded as two numbers, such as 120 over 80 (or 120/80). The first, higher number is called the systolic pressure. It is a measure of the pressure in your arteries as the heart beats. The second, lower  number is called the diastolic pressure. It is a measure of the pressure in your arteries as the heart relaxes between beats.  Keeping your blood pressure in a normal range is important to your overall health and prevention of health problems, such as heart disease and stroke. When your blood pressure is uncontrolled, your heart has to work harder than normal. High blood pressure is a very common condition in adults because blood pressure tends to rise with age. Men and women are equally likely to have hypertension but at different times in life. Before age 80, men are more likely to have hypertension. After 80 years of age, women are more likely to have it. Hypertension is especially common in African Americans. This condition often has no signs or symptoms. The cause of the condition is usually not known. Your caregiver can help you come up with a plan to keep your blood pressure in a normal, healthy range. BLOOD PRESSURE STAGES Blood pressure is classified into four stages: normal, prehypertension, stage 1, and stage 2. Your blood pressure reading will be used to determine what type of treatment, if any, is necessary. Appropriate treatment options are tied to these four stages:  Normal  Systolic pressure (mm Hg): below 120.  Diastolic pressure (mm Hg): below 80. Prehypertension  Systolic pressure (mm Hg): 120 to 139.  Diastolic pressure (mm Hg): 80 to 89.  Stage1  Systolic pressure (mm Hg): 140 to 159.  Diastolic pressure (mm Hg): 90 to 99. Stage2  Systolic pressure (mm Hg): 160 or above.  Diastolic pressure (mm Hg): 100 or above. RISKS RELATED TO HIGH BLOOD PRESSURE Managing your blood pressure is an important responsibility. Uncontrolled high blood pressure can lead to:  A heart attack.  A stroke.  A weakened blood vessel (aneurysm).  Heart failure.  Kidney damage.  Eye damage.  Metabolic syndrome.  Memory and concentration problems. HOW TO MANAGE YOUR BLOOD  PRESSURE Blood pressure can be managed effectively with lifestyle changes and medicines (if needed). Your caregiver will help you come up with a plan to bring your blood pressure within a normal range. Your plan should include the following: Education  Read all information provided by your caregivers about how to control blood pressure.  Educate yourself on the latest guidelines and treatment recommendations. New research is always being done to further define the risks and treatments for high blood pressure. Lifestylechanges  Control your weight.  Avoid smoking.  Stay physically active.  Reduce the amount of salt in your diet.  Reduce stress.  Control any chronic conditions, such as high cholesterol or diabetes.  Reduce your alcohol intake. Medicines  Several medicines (antihypertensive medicines) are available, if needed, to bring blood pressure within a normal range. Communication  Review all the medicines you take with your caregiver because there may be side effects or interactions.  Talk with your caregiver about your diet, exercise habits, and other lifestyle factors that may be contributing to high blood pressure.  See your caregiver regularly. Your caregiver can help you create and adjust your plan for managing high blood pressure. RECOMMENDATIONS FOR TREATMENT AND FOLLOW-UP  The following recommendations are based on current guidelines for managing high blood pressure in nonpregnant adults. Use these recommendations to identify the proper follow-up period or treatment option based on your blood pressure reading. You can discuss these options with your caregiver.  Systolic pressure of 120 to 139 or diastolic pressure of 80 to 89: Follow up with your caregiver as directed.  Systolic pressure of 140 to 160 or diastolic pressure of 90 to 100: Follow up with your caregiver within 2 months.  Systolic pressure above 160 or diastolic pressure above 100: Follow up with your  caregiver within 1 month.  Systolic pressure above 180 or diastolic pressure above 110: Consider antihypertensive therapy; follow up with your caregiver within 1 week.  Systolic pressure above 200 or diastolic pressure above 120: Begin antihypertensive therapy; follow up with your caregiver within 1 week.   This information is not intended to replace advice given to you by your health care provider. Make sure you discuss any questions you have with your health care provider.   Document Released: 02/24/2012 Document Reviewed: 02/24/2012 Elsevier Interactive Patient Education [employer]

## 2015-11-07 NOTE — Progress Notes (Addendum)
Subjective:    Patient ID: Hailey Taylor, female    DOB: 09/29/1921, 82 y.o.   MRN: [id number] Chief Complaint  Patient presents with  . Follow-up    follow up of thyroid and hypertension    HPI  Hailey Taylor is a 82 yo pt who is new to me. She was previously being followed by my colleague Dr. Milus GlazierLauenstein and Dr. Patsy Lageropland.  She was last seen 4 mos prior for a UTI worsening her Alzheimer's sxs to a mild delerium. This is her 6 month follow -up for her chronic medical problems.  Hypothyroid: On levothyroxine for several years - dose stable for over 4 yrs but on the low end since the weight loss.  HTN: lisinopril was started 1 1/2 yrs ago - takes at night as wonders if it iwas causing day-time falls.  Do not check BP at home though they do have a cuff. BP med was decreased from 20-12.5 to 10-12.5 at last visit.  However, then 5 months ago - every since the lsinoril her renal function sig worsened from 0.7 rto 1.2.  THere is a distinct difference - the right arm is always much higher for over 4-5 years. She had a left upper extremity arterial occlusive disease  May be present and rec an upper extremmieyt artherial doppler  Alzheimer's - mild, appetite decreasing to so weight loss at last visit. Has been steady since last visit as she has started Ensure with the meal - takes on average a bottle a day over 3 different times.  Has been giving her mother a 99mg  of otc K supp daily.  From [place] - moves here in 2011.  Past Medical History  Diagnosis Date  . Thyroid disease   . Hypertension    Past Surgical History  Procedure Laterality Date  . Total hip arthroplasty     Current Outpatient Prescriptions on File Prior to Visit  Medication Sig Dispense Refill  . aspirin 81 MG tablet Take 81 mg by mouth daily.    . EXELON 9.5 MG/24HR PLACE 1 PATCH (9.5 MG TOTAL) ONTO THE SKIN DAILY 90 patch 0  . levothyroxine (SYNTHROID, LEVOTHROID) 50 MCG tablet TAKE 1 TABLET BY MOUTH DAILY BEFORE BREAKFAST 90  tablet 3   No current facility-administered medications on file prior to visit.   No Known Allergies History reviewed. No pertinent family history. Social History   Social History  . Marital Status: Divorced    Spouse Name: N/A  . Number of Children: N/A  . Years of Education: N/A   Social History Main Topics  . Smoking status: Former Games developermoker  . Smokeless tobacco: None  . Alcohol Use: 0.0 oz/week    0 Standard drinks or equivalent per week  . Drug Use: No  . Sexual Activity: Not Asked   Other Topics Concern  . None   Social History Narrative     Review of Systems  Constitutional: Positive for appetite change. Negative for fever, chills, activity change and unexpected weight change.  Respiratory: Negative for shortness of breath.   Cardiovascular: Negative for chest pain and leg swelling.  Gastrointestinal: Negative for nausea, vomiting, abdominal pain, diarrhea and constipation.  Genitourinary: Positive for frequency and enuresis. Negative for dysuria, flank pain and difficulty urinating.  Allergic/Immunologic: Negative for immunocompromised state.  Neurological: Positive for weakness. Negative for dizziness, light-headedness and numbness.  Psychiatric/Behavioral: Positive for confusion and decreased concentration. Negative for behavioral problems, sleep disturbance, dysphoric mood and agitation. The patient is not nervous/anxious.  Objective:  BP 117/76 mmHg  Pulse 62  Temp(Src) 97.9 F (36.6 C) (Oral)  Resp 20  Wt 100 lb 8 oz (45.587 kg)  SpO2 97%  Physical Exam  Constitutional: She appears well-developed. She appears cachectic. No distress.  HENT:  Head: Normocephalic and atraumatic.  Right Ear: External ear normal.  Left Ear: External ear normal.  Eyes: Conjunctivae are normal. No scleral icterus.  Neck: Normal range of motion. Neck supple. No thyromegaly present.  Cardiovascular: Normal rate, regular rhythm, normal heart sounds and intact distal  pulses.   Pulmonary/Chest: Effort normal and breath sounds normal. No respiratory distress.  Genitourinary:    There is no rash, tenderness or lesion on the right labia. There is no rash, tenderness or lesion on the left labia.  erythematous nodule with central 1-2 mm ulceration, no drainage, + tenderness, no fluctuance, minimal superficial inderation  Musculoskeletal: She exhibits no edema.  Lymphadenopathy:    She has no cervical adenopathy.  Neurological: She is alert. She is disoriented.  Skin: Skin is warm and dry. She is not diaphoretic. No erythema.  Psychiatric: She has a normal mood and affect. Her behavior is normal.            Assessment & Plan:   1. Essential hypertension   2. Hypothyroidism, unspecified hypothyroidism type   3. Female perineal bleeding - warm wet compress with top mupirocin - suspect ingrown hair irritation  4. Late onset Alzheimer's disease without behavioral disturbance   5. Decreased renal function - since starting lisinopril - trial off then recheck Cr in sev mos.    Orders Placed This Encounter  Procedures  . Urine culture    Standing Status: Future     Number of Occurrences:      Standing Expiration Date: 11/06/2016  . Urine culture  . WOUND CULTURE ([facility] ONLY)    Order Specific Question:  Source    Answer:  left inner upper thigh  . TSH  . Basic metabolic panel    Order Specific Question:  Has the patient fasted?    Answer:  No  . POCT Microscopic Urinalysis ([facility])    Standing Status: Future     Number of Occurrences:      Standing Expiration Date: 01/06/2017  . POCT urinalysis dipstick    Standing Status: Future     Number of Occurrences:      Standing Expiration Date: 11/06/2016  . POCT urinalysis dipstick  . POCT Microscopic Urinalysis ([facility])    Meds ordered this encounter  Medications  . Cholecalciferol (VITAMIN D3) 5000 units CAPS    Sig: Take 1 capsule by mouth daily.  . mupirocin ointment (BACTROBAN) 2 %    Sig:  Apply 1 application topically 4 (four) times daily.    Dispense:  30 g    Refill:  1  . hydrochlorothiazide (MICROZIDE) 12.5 MG capsule    Sig: Take 1 capsule (12.5 mg total) by mouth daily.    Dispense:  30 capsule    Refill:  1    Norberto Sorenson, MD MPH

## 2015-11-10 LAB — URINE CULTURE

## 2015-11-12 LAB — WOUND CULTURE: GRAM STAIN: NONE SEEN

## 2015-11-25 ENCOUNTER — Encounter: Payer: Self-pay | Admitting: Family Medicine

## 2015-11-25 DIAGNOSIS — I129 Hypertensive chronic kidney disease with stage 1 through stage 4 chronic kidney disease, or unspecified chronic kidney disease: Secondary | ICD-10-CM | POA: Insufficient documentation

## 2015-11-25 DIAGNOSIS — N3942 Incontinence without sensory awareness: Secondary | ICD-10-CM | POA: Insufficient documentation

## 2015-11-25 DIAGNOSIS — N183 Chronic kidney disease, stage 3 (moderate): Secondary | ICD-10-CM

## 2015-11-25 DIAGNOSIS — E860 Dehydration: Secondary | ICD-10-CM | POA: Insufficient documentation

## 2015-12-31 ENCOUNTER — Other Ambulatory Visit: Payer: Self-pay | Admitting: Family Medicine

## 2016-01-09 ENCOUNTER — Ambulatory Visit (INDEPENDENT_AMBULATORY_CARE_PROVIDER_SITE_OTHER): Payer: Medicare Other | Admitting: Family Medicine

## 2016-01-09 VITALS — BP 138/80 | HR 71 | Temp 97.5°F | Resp 18 | Ht 68.0 in | Wt 106.6 lb

## 2016-01-09 DIAGNOSIS — F0280 Dementia in other diseases classified elsewhere without behavioral disturbance: Secondary | ICD-10-CM

## 2016-01-09 DIAGNOSIS — G301 Alzheimer's disease with late onset: Secondary | ICD-10-CM

## 2016-01-09 DIAGNOSIS — I10 Essential (primary) hypertension: Secondary | ICD-10-CM

## 2016-01-09 DIAGNOSIS — N390 Urinary tract infection, site not specified: Secondary | ICD-10-CM | POA: Diagnosis not present

## 2016-01-09 DIAGNOSIS — R634 Abnormal weight loss: Secondary | ICD-10-CM

## 2016-01-09 DIAGNOSIS — N3942 Incontinence without sensory awareness: Secondary | ICD-10-CM | POA: Diagnosis not present

## 2016-01-09 DIAGNOSIS — N183 Chronic kidney disease, stage 3 unspecified: Secondary | ICD-10-CM

## 2016-01-09 DIAGNOSIS — E039 Hypothyroidism, unspecified: Secondary | ICD-10-CM

## 2016-01-09 DIAGNOSIS — I129 Hypertensive chronic kidney disease with stage 1 through stage 4 chronic kidney disease, or unspecified chronic kidney disease: Secondary | ICD-10-CM

## 2016-01-09 LAB — POCT CBC
Granulocyte percent: 4.3 %G — AB (ref 37–80)
HCT, POC: 32.8 % — AB (ref 37.7–47.9)
HEMOGLOBIN: 11.3 g/dL — AB (ref 12.2–16.2)
LYMPH, POC: 1.5 (ref 0.6–3.4)
MCH: 30.9 pg (ref 27–31.2)
MCHC: 34.4 g/dL (ref 31.8–35.4)
MCV: 89.9 fL (ref 80–97)
MID (CBC): 0.5 (ref 0–0.9)
MPV: 7.4 fL (ref 0–99.8)
PLATELET COUNT, POC: 300 10*3/uL (ref 142–424)
POC Granulocyte: 5.6 (ref 2–6.9)
POC LYMPH PERCENT: 19.4 %L (ref 10–50)
POC MID %: 6.3 %M (ref 0–12)
RBC: 3.65 M/uL — AB (ref 4.04–5.48)
RDW, POC: 13.2 %
WBC: 7.5 10*3/uL (ref 4.6–10.2)

## 2016-01-09 LAB — COMPREHENSIVE METABOLIC PANEL
ALK PHOS: 76 U/L (ref 33–130)
ALT: 9 U/L (ref 6–29)
AST: 16 U/L (ref 10–35)
Albumin: 3.9 g/dL (ref 3.6–5.1)
BUN: 29 mg/dL — ABNORMAL HIGH (ref 7–25)
CHLORIDE: 100 mmol/L (ref 98–110)
CO2: 29 mmol/L (ref 20–31)
Calcium: 9.3 mg/dL (ref 8.6–10.4)
Creat: 0.94 mg/dL — ABNORMAL HIGH (ref 0.60–0.88)
Glucose, Bld: 91 mg/dL (ref 65–99)
Potassium: 4.1 mmol/L (ref 3.5–5.3)
SODIUM: 137 mmol/L (ref 135–146)
Total Bilirubin: 0.4 mg/dL (ref 0.2–1.2)
Total Protein: 6.9 g/dL (ref 6.1–8.1)

## 2016-01-09 LAB — POCT URINALYSIS DIP (MANUAL ENTRY)
BILIRUBIN UA: NEGATIVE
GLUCOSE UA: NEGATIVE
Ketones, POC UA: NEGATIVE
Nitrite, UA: NEGATIVE
Protein Ur, POC: NEGATIVE
RBC UA: NEGATIVE
SPEC GRAV UA: 1.015
UROBILINOGEN UA: 0.2
pH, UA: 5

## 2016-01-09 LAB — POC MICROSCOPIC URINALYSIS (UMFC): MUCUS RE: ABSENT

## 2016-01-09 LAB — TSH: TSH: 3.59 m[IU]/L

## 2016-01-09 MED ORDER — MUPIROCIN 2 % EX OINT: 1.0000 "application " | TOPICAL_OINTMENT | Freq: Four times a day (QID) | CUTANEOUS | 1 refills | Status: DC

## 2016-01-09 MED ORDER — HYDROCHLOROTHIAZIDE 25 MG PO TABS: 25.0000 mg | ORAL_TABLET | Freq: Every day | ORAL | 1 refills | Status: DC

## 2016-01-09 NOTE — Patient Instructions (Signed)
Managing Your High Blood Pressure °Blood pressure is a measurement of how forceful your blood is pressing against the walls of the arteries. Arteries are muscular tubes within the circulatory system. Blood pressure does not stay the same. Blood pressure rises when you are active, excited, or nervous; and it lowers during sleep and relaxation. If the numbers measuring your blood pressure stay above normal most of the time, you are at risk for health problems. High blood pressure (hypertension) is a long-term (chronic) condition in which blood pressure is elevated. °A blood pressure reading is recorded as two numbers, such as 120 over 80 (or 120/80). The first, higher number is called the systolic pressure. It is a measure of the pressure in your arteries as the heart beats. The second, lower number is called the diastolic pressure. It is a measure of the pressure in your arteries as the heart relaxes between beats.  °Keeping your blood pressure in a normal range is important to your overall health and prevention of health problems, such as heart disease and stroke. When your blood pressure is uncontrolled, your heart has to work harder than normal. High blood pressure is a very common condition in adults because blood pressure tends to rise with age. Men and women are equally likely to have hypertension but at different times in life. Before age 45, men are more likely to have hypertension. After 80 years of age, women are more likely to have it. Hypertension is especially common in African Americans. This condition often has no signs or symptoms. The cause of the condition is usually not known. Your caregiver can help you come up with a plan to keep your blood pressure in a normal, healthy range. °BLOOD PRESSURE STAGES °Blood pressure is classified into four stages: normal, prehypertension, stage 1, and stage 2. Your blood pressure reading will be used to determine what type of treatment, if any, is necessary.  Appropriate treatment options are tied to these four stages:  °Normal °· Systolic pressure (mm Hg): below 120. °· Diastolic pressure (mm Hg): below 80. °Prehypertension °· Systolic pressure (mm Hg): 120 to 139. °· Diastolic pressure (mm Hg): 80 to 89. °Stage 1 °· Systolic pressure (mm Hg): 140 to 159. °· Diastolic pressure (mm Hg): 90 to 99. °Stage 2 °· Systolic pressure (mm Hg): 160 or above. °· Diastolic pressure (mm Hg): 100 or above. °RISKS RELATED TO HIGH BLOOD PRESSURE °Managing your blood pressure is an important responsibility. Uncontrolled high blood pressure can lead to: °· A heart attack. °· A stroke. °· A weakened blood vessel (aneurysm). °· Heart failure. °· Kidney damage. °· Eye damage. °· Metabolic syndrome. °· Memory and concentration problems. °HOW TO MANAGE YOUR BLOOD PRESSURE °Blood pressure can be managed effectively with lifestyle changes and medicines (if needed). Your caregiver will help you come up with a plan to bring your blood pressure within a normal range. Your plan should include the following: °Education °· Read all information provided by your caregivers about how to control blood pressure. °· Educate yourself on the latest guidelines and treatment recommendations. New research is always being done to further define the risks and treatments for high blood pressure. °Lifestyle changes °· Control your weight. °· Avoid smoking. °· Stay physically active. °· Reduce the amount of salt in your diet. °· Reduce stress. °· Control any chronic conditions, such as high cholesterol or diabetes. °· Reduce your alcohol intake. °Medicines °· Several medicines (antihypertensive medicines) are available, if needed, to bring blood pressure within a normal range. °  Communication °· Review all the medicines you take with your caregiver because there may be side effects or interactions. °· Talk with your caregiver about your diet, exercise habits, and other lifestyle factors that may be contributing to  high blood pressure. °· See your caregiver regularly. Your caregiver can help you create and adjust your plan for managing high blood pressure. °RECOMMENDATIONS FOR TREATMENT AND FOLLOW-UP  °The following recommendations are based on current guidelines for managing high blood pressure in nonpregnant adults. Use these recommendations to identify the proper follow-up period or treatment option based on your blood pressure reading. You can discuss these options with your caregiver. °· Systolic pressure of 120 to 139 or diastolic pressure of 80 to 89: Follow up with your caregiver as directed. °· Systolic pressure of 140 to 160 or diastolic pressure of 90 to 100: Follow up with your caregiver within 2 months. °· Systolic pressure above 160 or diastolic pressure above 100: Follow up with your caregiver within 1 month. °· Systolic pressure above 180 or diastolic pressure above 110: Consider antihypertensive therapy; follow up with your caregiver within 1 week. °· Systolic pressure above 200 or diastolic pressure above 120: Begin antihypertensive therapy; follow up with your caregiver within 1 week. °  °This information is not intended to replace advice given to you by your health care provider. Make sure you discuss any questions you have with your health care provider. °  °Document Released: 02/24/2012 Document Reviewed: 02/24/2012 °Elsevier Interactive Patient Education ©2016 [employer] Inc[employer] ° °

## 2016-01-09 NOTE — Progress Notes (Signed)
Subjective:    Patient ID: Hailey Taylor, female    DOB: 06-27-1921, 82 y.o.   MRN: [id number] Chief Complaint  Patient presents with  . Follow-up    HPI  Hailey Taylor is a 82 yo woman here for a 2 month follow-up on her chronic medical conditions.  She is here with her daughter who is her caretaker.  Hypothyroid: On levothyroxine for several years - dose stable for over 4 yrs.  Weight loss: good appetite and drinking ensure - has PB&J and ensure before bed.  HTN: Had lisinopril was started 1 1/2 yrs ago - takes at night as wonders if it iwas causing day-time falls.  Do not check BP at home though they do have a cuff. BP med was decreased from 20-12.5 to 10-12.5 at last visit.  However, then 5 months ago - every since the lsinoril her renal function sig worsened from 0.7 rto 1.2.  THere is a distinct difference - the right arm is always much higher for over 4-5 years. She had a left upper extremity arterial occlusive disease  May be present and rec an upper extremmieyt artherial doppler She has been on hctz only and her feet are more swollen than they normally are but her blood pressure is better.  Gives her her hctz at night along with a potassium.  Uses the bedside commode overnight.  Doesn't know how many times she go tup to use the bathroom.  Left side is always low.  Has been having more lower extremity edema.  Just feet puff out.  Drinks fluids all day long - a lot of green tea, ensure, 1 c of coffee, water, occ sprite.  Not much salt in diet. Plenty of protein - bacon in a.m.  Developed another furuncle on pelvis but her daughter treated it with warm compresses and topical mupirocin with excellent result.  Alzheimer's - mild, appetite decreasing to so weight loss at last visit. Has been steady since last visit as she has started Ensure with the meal - takes on average a bottle a day over 3 different times.  Daughter has been giving her mother a 99mg  of otc K supp daily.  From  [place] - moves here in 2011.  Past Medical History:  Diagnosis Date  . Hypertension   . Thyroid disease    Past Surgical History:  Procedure Laterality Date  . TOTAL HIP ARTHROPLASTY     Current Outpatient Prescriptions on File Prior to Visit  Medication Sig Dispense Refill  . aspirin 81 MG tablet Take 81 mg by mouth daily.    . Cholecalciferol (VITAMIN D3) 5000 units CAPS Take 1 capsule by mouth daily.    . EXELON 9.5 MG/24HR PLACE 1 PATCH (9.5 MG TOTAL) ONTO THE SKIN DAILY 90 patch 0  . levothyroxine (SYNTHROID, LEVOTHROID) 50 MCG tablet TAKE 1 TABLET BY MOUTH DAILY BEFORE BREAKFAST 90 tablet 3   No current facility-administered medications on file prior to visit.    Allergies  Allergen Reactions  . Lisinopril Other (See Comments)    Causes acute renal failure   No family history on file. Social History   Social History  . Marital status: Divorced    Spouse name: N/A  . Number of children: N/A  . Years of education: N/A   Social History Main Topics  . Smoking status: Former Games developer  . Smokeless tobacco: Not on file  . Alcohol use 0.0 oz/week  . Drug use: No  . Sexual activity: Not  on file   Other Topics Concern  . Not on file   Social History Narrative  . No narrative on file    Review of Systems  Constitutional: Negative for activity change, appetite change, chills, diaphoresis, fever and unexpected weight change.  Respiratory: Negative for apnea, cough, chest tightness and shortness of breath.   Cardiovascular: Positive for leg swelling. Negative for chest pain and palpitations.  Gastrointestinal: Negative for abdominal distention, abdominal pain, constipation, diarrhea and vomiting.  Genitourinary: Positive for enuresis, frequency and genital sores. Negative for decreased urine volume, difficulty urinating, vaginal bleeding, vaginal discharge and vaginal pain.  Allergic/Immunologic: Negative for food allergies.  Neurological: Positive for weakness. Negative  for syncope.  Psychiatric/Behavioral: Positive for confusion. Negative for agitation, behavioral problems, dysphoric mood, hallucinations and sleep disturbance. The patient is not nervous/anxious.        Objective:   Physical Exam  Constitutional: She is oriented to person, place, and time. She appears well-developed. No distress.  Thin, frail  HENT:  Head: Normocephalic and atraumatic.  Right Ear: External ear normal.  Left Ear: External ear normal.  Eyes: Conjunctivae are normal. No scleral icterus.  Neck: Normal range of motion. Neck supple. No thyromegaly present.  Cardiovascular: Normal rate, regular rhythm, normal heart sounds and intact distal pulses.   Pulmonary/Chest: Effort normal and breath sounds normal. No respiratory distress.  Musculoskeletal: She exhibits no edema.  Lymphadenopathy:    She has no cervical adenopathy.  Neurological: She is alert and oriented to person, place, and time.  Skin: Skin is warm and dry. She is not diaphoretic. No erythema.  Psychiatric: She has a normal mood and affect. Her behavior is normal.      BP 138/80 (BP Location: Right Arm)   Pulse 71   Temp 97.5 F (36.4 C) (Oral)   Resp 18   Ht 5\' 8"  (1.727 m)   Wt 116 lb 12.8 oz (53 kg)   SpO2 99%   BMI 17.76 kg/m      Assessment & Plan:  Anemia new today, mild so recheck at f/u; hgb 12.6->11.3 today F/u 4 mos 1. Essential hypertension - increased hctz from 12.5 to 25 due to c/o of worsening lower ext edema since stopping her lisinopril at last OV. Gets plenty of water and protein in diet  2. Stage 3 chronic kidney disease due to arterionephrosclerosis - renal function had worsened since starting on lisinopril - now has been off for 2 mos and renal function has improved sig back to baseline (Cr was 0.7 before lisinopril, jumped to 1.25 on lisinopril, now 2 mos off it is the lower at 0.94 than it ever was on the acei). Will add acei to allergy list - likely pt has some mild RAS.  3.  Hypothyroidism, unspecified hypothyroidism type - stable x 4 yrs- refill x 1 yr whenever needed  4. Late onset Alzheimer's disease without behavioral disturbance   5. Urinary incontinence without sensory awareness   6. Recurrent UTI   7. Loss of weight plateaud since adding ensure    Orders Placed This Encounter  Procedures  . Urine culture  . Comprehensive metabolic panel  . TSH  . POCT CBC  . POCT Microscopic Urinalysis ([facility])  . POCT urinalysis dipstick    Meds ordered this encounter  Medications  . Potassium Bicarbonate 99 MG CAPS    Sig: Take 99 mEq by mouth 1 day or 1 dose.  . mupirocin ointment (BACTROBAN) 2 %    Sig: Apply 1  application topically 4 (four) times daily.    Dispense:  30 g    Refill:  1    Please hold on file for pt for when needed  . hydrochlorothiazide (HYDRODIURIL) 25 MG tablet    Sig: Take 1 tablet (25 mg total) by mouth daily.    Dispense:  90 tablet    Refill:  1    Norberto Sorenson, M.D.  [facility] [address] [place], [place] [address] [phone] phone [phone] fax  01/12/16 7:47 AM  Results for orders placed or performed in visit on 01/09/16  Urine culture  Result Value Ref Range   Organism ID, Bacteria NO GROWTH   Comprehensive metabolic panel  Result Value Ref Range   Sodium 137 135 - 146 mmol/L   Potassium 4.1 3.5 - 5.3 mmol/L   Chloride 100 98 - 110 mmol/L   CO2 29 20 - 31 mmol/L   Glucose, Bld 91 65 - 99 mg/dL   BUN 29 (H) 7 - 25 mg/dL   Creat 1.30 (H) 8.65 - 0.88 mg/dL   Total Bilirubin 0.4 0.2 - 1.2 mg/dL   Alkaline Phosphatase 76 33 - 130 U/L   AST 16 10 - 35 U/L   ALT 9 6 - 29 U/L   Total Protein 6.9 6.1 - 8.1 g/dL   Albumin 3.9 3.6 - 5.1 g/dL   Calcium 9.3 8.6 - 78.4 mg/dL  TSH  Result Value Ref Range   TSH 3.59 mIU/L  POCT CBC  Result Value Ref Range   WBC 7.5 4.6 - 10.2 K/uL   Lymph, poc 1.5 0.6 - 3.4   POC LYMPH PERCENT 19.4 10 - 50 %L   MID (cbc) 0.5 0 - 0.9   POC MID  % 6.3 0 - 12 %M   POC Granulocyte 5.6 2 - 6.9   Granulocyte percent 4.3 (A) 37 - 80 %G   RBC 3.65 (A) 4.04 - 5.48 M/uL   Hemoglobin 11.3 (A) 12.2 - 16.2 g/dL   HCT, POC 69.6 (A) 29.5 - 47.9 %   MCV 89.9 80 - 97 fL   MCH, POC 30.9 27 - 31.2 pg   MCHC 34.4 31.8 - 35.4 g/dL   RDW, POC 28.4 %   Platelet Count, POC 300 142 - 424 K/uL   MPV 7.4 0 - 99.8 fL  POCT Microscopic Urinalysis ([facility])  Result Value Ref Range   WBC,UR,HPF,POC Few (A) None WBC/hpf   RBC,UR,HPF,POC None None RBC/hpf   Bacteria Few (A) None, Too numerous to count   Mucus Absent Absent   Epithelial Cells, UR Per Microscopy Few (A) None, Too numerous to count cells/hpf   Calcium Oxalate Crystal few   POCT urinalysis dipstick  Result Value Ref Range   Color, UA yellow yellow   Clarity, UA clear clear   Glucose, UA negative negative   Bilirubin, UA negative negative   Ketones, POC UA negative negative   Spec Grav, UA 1.015    Blood, UA negative negative   pH, UA 5.0    Protein Ur, POC negative negative   Urobilinogen, UA 0.2    Nitrite, UA Negative Negative   Leukocytes, UA Trace (A) Negative

## 2016-01-11 LAB — URINE CULTURE: ORGANISM ID, BACTERIA: NO GROWTH

## 2016-01-12 ENCOUNTER — Other Ambulatory Visit: Payer: Self-pay | Admitting: Family Medicine

## 2016-01-13 NOTE — Telephone Encounter (Signed)
Dr Clelia Croft, you just saw pt for some other meds. She est care with you recently. Do you want her to remain on this med and want to give RFs, or do you want her back to discuss?

## 2016-03-31 ENCOUNTER — Telehealth: Payer: Self-pay | Admitting: Family Medicine

## 2016-03-31 NOTE — Telephone Encounter (Signed)
lmom for pt to reschedule her appt that she had with Dr Clelia CroftShaw on 05/14/2016 for a OV Dr Clelia CroftShaw will be in class that day

## 2016-05-14 ENCOUNTER — Ambulatory Visit: Payer: Medicare Other | Admitting: Family Medicine

## 2016-05-14 ENCOUNTER — Other Ambulatory Visit: Payer: Self-pay | Admitting: Family Medicine

## 2016-05-14 DIAGNOSIS — E034 Atrophy of thyroid (acquired): Secondary | ICD-10-CM

## 2016-05-15 ENCOUNTER — Other Ambulatory Visit: Payer: Self-pay | Admitting: Emergency Medicine

## 2016-05-15 DIAGNOSIS — E034 Atrophy of thyroid (acquired): Secondary | ICD-10-CM

## 2016-05-15 MED ORDER — LEVOTHYROXINE SODIUM 50 MCG PO TABS: ORAL_TABLET | ORAL | 1 refills | Status: DC

## 2016-05-15 NOTE — Telephone Encounter (Signed)
Received refill request for LEVOTHYROXINE 50 MCG TABLET. Last office visit 07/10/15 and last refill 01/09/16.

## 2016-05-21 ENCOUNTER — Ambulatory Visit: Payer: Medicare Other | Admitting: Family Medicine

## 2016-05-21 ENCOUNTER — Encounter: Payer: Self-pay | Admitting: Family Medicine

## 2016-05-21 ENCOUNTER — Ambulatory Visit (INDEPENDENT_AMBULATORY_CARE_PROVIDER_SITE_OTHER): Payer: Medicare Other | Admitting: Family Medicine

## 2016-05-21 VITALS — BP 138/70 | HR 69 | Temp 97.8°F | Resp 16 | Ht 65.5 in | Wt 102.6 lb

## 2016-05-21 DIAGNOSIS — I10 Essential (primary) hypertension: Secondary | ICD-10-CM

## 2016-05-21 DIAGNOSIS — E039 Hypothyroidism, unspecified: Secondary | ICD-10-CM | POA: Diagnosis not present

## 2016-05-21 DIAGNOSIS — F0280 Dementia in other diseases classified elsewhere without behavioral disturbance: Secondary | ICD-10-CM | POA: Diagnosis not present

## 2016-05-21 DIAGNOSIS — D649 Anemia, unspecified: Secondary | ICD-10-CM

## 2016-05-21 DIAGNOSIS — G301 Alzheimer's disease with late onset: Secondary | ICD-10-CM

## 2016-05-21 NOTE — Patient Instructions (Addendum)
IF you received an x-ray today, you will receive an [facility]. Please contact [facility] at [phone] with questions or concerns regarding your invoice.   IF you received labwork today, you will receive an invoice from [facility]. Please contact [facility] at [phone] with questions or concerns regarding your invoice.   Our billing staff will not be able to assist you with questions regarding bills from these companies.  You will be contacted with the lab results as soon as they are available. The fastest way to get your results is to activate your My Chart account. Instructions are located on the last page of this paperwork. If you have not heard from us regarding the results in 2 weeks, please contact this office.    Alzheimer Disease Caregiver Guide Alzheimer disease is an illness that affects a person's brain. It causes a person to lose the ability to remember things and make good decisions. As the disease progresses, the person is unable to take care of himself or herself and needs more and more help to do simple tasks. Taking care of someone with Alzheimer disease can be very challenging and overwhelming. Memory loss and confusion Memory loss and confusion is mild in the beginning stages of the disease. Both of these problems become more severe as the disease progresses. Eventually, the person will not recognize places or even close family members and friends.  Stay calm.  Respond with a short explanation. Long explanations can be overwhelming and confusing.  Avoid corrections that sound like scolding.  Try not to take it personally, even if the person forgets your name. Behavior changes Behavior changes are part of the disease. The person may develop depression, anxiety, anger, hallucinations, or other behavior changes. These changes can come on suddenly and may be in response to pain, infection, changes in the  environment (temperature, noise), overstimulation, or feeling lost or scared.  Try not to take behavior changes personally.  Remain calm and patient.  Do not argue or try to convince the person about a specific point. This will only make him or her more agitated.  Know that the behavior changes are part of the disease process and try to work through it. Tips to reduce frustration  Schedule wisely by making appointments and doing daily tasks, like bathing and dressing, when the person is at his or her best.  Take your time. Simple tasks may take a lot longer, so be sure to allow for plenty of time.  Limit choices. Too many choices can be overwhelming and stressful for the person.  Involve the person in what you are doing.  Stick to a routine.  Avoid new or crowded situations, if possible.  Use simple words, short sentences, and a calm voice. Only give one direction at a time.  Buy clothes and shoes that are easy to put on and take off.  Let people help if they offer. Home safety Keeping the home safe is very important to reduce the risk of falls and injuries.  Keep floors clear of clutter. Remove rugs, magazine racks, and floor lamps.  Keep hallways well lit.  Put a handrail and nonslip mat in the bathtub or shower.  Put childproof locks on cabinets with dangerous items, such as medicine, alcohol, guns, toxic cleaning items, sharp tools or utensils, matches, or lighters.  Place locks on doors where the person cannot easily see or reach them. This helps ensure that the person cannot wander out  of the house and get lost.  Be prepared for emergencies. Keep a list of emergency phone numbers and addresses in a convenient area. Plans for the future  Do not put off talking about finances.  Talk about money management. People with Alzheimer disease have trouble managing their money as the disease gets worse.  Get help from professional advisors regarding financial and legal  matters.  Do not put off talking about future care.  Choose a power of attorney. This is someone who can make decisions for the person with Alzheimer disease when he or she is no longer able to do so.  Talk about driving and when it is the right time to stop. The person's health care provider can help give advice on this matter.  Talk about the person's living situation. If he or she lives alone, you need to make sure he or she is safe. Some people need extra help at home, and others need more care at a nursing home or care center. Support groups Joining a support group can be very helpful for caregivers of people with Alzheimer disease. Some advantages to being part of a support group include:  Getting strategies to manage stress.  Sharing experiences with others.  Receiving emotional comfort and support.  Learning new caregiving skills as the disease progresses.  Knowing what community resources are available and taking advantage of them. Contact a health care provider if:  The person has a fever.  The person has a sudden change in behavior that does not improve with calming strategies.  The person is unable to manage in his or her current living situation.  The person threatens you or anyone else, including himself or herself.  You are no longer able to care for the person. This information is not intended to replace advice given to you by your health care provider. Make sure you discuss any questions you have with your health care provider. Document Released: 02/11/2004 Document Revised: 11/13/2015 Document Reviewed: 07/08/2011 Elsevier Interactive Patient Education  2017 [employer]

## 2016-05-21 NOTE — Progress Notes (Signed)
Subjective:    Patient ID: Hailey Taylor, female    DOB: 05/31/1922, 82 y.o.   MRN: [id number] Chief Complaint  Patient presents with  . Follow-up    MEDICATION for blood pressure    HPI   Hailey Taylor is a 82 yo woman here for a 4 month follow-up on her chronic medical conditions.  She is here with her daughter who is her caretaker.  Hypothyroid: On levothyroxine for several years - dose stable for over 4 yrs.  Weight loss: good appetite and drinking ensure - has PB&J and ensure before bed. overal astable but need to keep > 100 lbs  HTN: On hctz 25 (was increased from 12.5 at last visit due to lower extremity edema). Lisinoprilo caused a sig imcnrease in Cr so was stopped, cr 0.7 -> on lisinopril1.25 -> 0.94 off lisino;pril so suspect mild RAS.  Still takes it at night. Daughter thinks it increases falls and potentially even syncope during day and states she doesn't have any trouble getting up o/n to use the bedside commod.  Do not check BP at home though they do have a cuff. THere is a distinct difference - the right arm is always much higher for over 4-5 years. She had a left upper extremity arterial occlusive disease May be present and rec an upper extremmieyt artherial doppler.  Left side is always low.  Drinks fluids all day long - a lot of green tea, ensure, 1 c of coffee, water, occ sprite.  Not much salt in diet. Plenty of protein - bacon in a.m. CKDIII Developed another furuncle on pelvis but her daughter treated it with warm compresses and topical mupirocin with excellent result.  Alzheimer's - mild, appetite decreasing to so weight loss at last visit. Has been steady since last visit as she has started Ensure with the meal - takes on average a bottle a day over 3 different times. Sig progression since last visit.  They contacted [facility] after I recommended it at a prior visit but were unable to afford.  Past Medical History:  Diagnosis Date  . Hypertension   . Thyroid  disease    Past Surgical History:  Procedure Laterality Date  . TOTAL HIP ARTHROPLASTY     Current Outpatient Prescriptions on File Prior to Visit  Medication Sig Dispense Refill  . aspirin 81 MG tablet Take 81 mg by mouth daily.    . Cholecalciferol (VITAMIN D3) 5000 units CAPS Take 1 capsule by mouth daily.    . EXELON 9.5 MG/24HR PLACE 1 PATCH (9.5 MG TOTAL) ONTO THE SKIN DAILY 90 patch 1  . hydrochlorothiazide (HYDRODIURIL) 25 MG tablet Take 1 tablet (25 mg total) by mouth daily. 90 tablet 1  . levothyroxine (SYNTHROID, LEVOTHROID) 50 MCG tablet TAKE 1 TABLET BY MOUTH DAILY BEFORE BREAKFAST 90 tablet 1  . mupirocin ointment (BACTROBAN) 2 % Apply 1 application topically 4 (four) times daily. 30 g 1  . Potassium Bicarbonate 99 MG CAPS Take 99 mEq by mouth 1 day or 1 dose.     No current facility-administered medications on file prior to visit.    Allergies  Allergen Reactions  . Lisinopril Other (See Comments)    Causes acute renal failure   No family history on file. Social History   Social History  . Marital status: Divorced    Spouse name: N/A  . Number of children: N/A  . Years of education: N/A   Social History Main Topics  . Smoking status:  Former Smoker  . Smokeless tobacco: None  . Alcohol use 0.0 oz/week  . Drug use: No  . Sexual activity: Not Asked   Other Topics Concern  . None   Social History Narrative  . None   [facility] 05/21/2016 01/09/2016 11/07/2015 07/10/2015 05/15/2015  Decreased Interest 0 0 0 0 0  Down, Depressed, Hopeless 0 0 0 0 0  PHQ - 2 Score 0 0 0 0 0     Review of Systems  Unable to perform ROS: Dementia   See hpi    Objective:   Physical Exam  Constitutional: She is oriented to person, place, and time. She appears well-developed and well-nourished. No distress.  HENT:  Head: Normocephalic and atraumatic.  Right Ear: External ear normal.  Left Ear: External ear normal.  Eyes: Conjunctivae are normal. No scleral  icterus.  Neck: Normal range of motion. Neck supple. No thyromegaly present.  Cardiovascular: Normal rate, regular rhythm, normal heart sounds and intact distal pulses.   Pulmonary/Chest: Effort normal and breath sounds normal. No respiratory distress.  Musculoskeletal: She exhibits no edema.  Lymphadenopathy:    She has no cervical adenopathy.  Neurological: She is alert and oriented to person, place, and time.  Skin: Skin is warm and dry. She is not diaphoretic. No erythema.  Psychiatric: She has a normal mood and affect. Her behavior is normal. Cognition and memory are impaired. She exhibits abnormal recent memory and abnormal remote memory.     BP 140/64 (BP Location: Right Arm, Patient Position: Sitting, Cuff Size: Normal)   Pulse 69   Temp 97.8 F (36.6 C) (Oral)   Resp 16   Ht 5' 5.5" (1.664 m)   Wt 102 lb 9.6 oz (46.5 kg)   SpO2 100%   BMI 16.81 kg/m       Assessment & Plan:   Refuses flu shot  1. Late onset Alzheimer's disease without behavioral disturbance - refer to hospice, pt has had Alzheimer's diagnosis for many years but has had a very precipitous decline in the past several months.  2. Essential hypertension, benign - bmp  3. Hypothyroidism, unspecified type - tsh  4. Anemia, unspecified type - cbc    Orders Placed This Encounter  Procedures  . CBC  . TSH  . Basic metabolic panel  . Ambulatory referral to Hospice    Referral Priority:   Routine    Referral Type:   Consultation    Referral Reason:   Specialty Services Required    Requested Specialty:   Hospice Services    Number of Visits Requested:   1  . Care order/instruction:    AVS and GO    Scheduling Instructions:     AVS and GO    Meds ordered this encounter  Medications  . OVER THE COUNTER MEDICATION    I personally performed the services described in this documentation, which was scribed in my presence. The recorded information has been reviewed and considered, and addended by me as  needed.   Norberto SorensonEva Jenelle Drennon, M.D.  [facility]  [place] [address] [place], [place] [address] [phone] phone [phone] fax  06/16/16 7:47 AM

## 2016-05-22 LAB — CBC
HEMATOCRIT: 36 % (ref 34.0–46.6)
Hemoglobin: 12.2 g/dL (ref 11.1–15.9)
MCH: 29.8 pg (ref 26.6–33.0)
MCHC: 33.9 g/dL (ref 31.5–35.7)
MCV: 88 fL (ref 79–97)
Platelets: 322 10*3/uL (ref 150–379)
RBC: 4.09 x10E6/uL (ref 3.77–5.28)
RDW: 14.2 % (ref 12.3–15.4)
WBC: 7.3 10*3/uL (ref 3.4–10.8)

## 2016-05-22 LAB — BASIC METABOLIC PANEL
BUN / CREAT RATIO: 27 (ref 12–28)
BUN: 25 mg/dL (ref 10–36)
CO2: 28 mmol/L (ref 18–29)
CREATININE: 0.94 mg/dL (ref 0.57–1.00)
Calcium: 9.7 mg/dL (ref 8.7–10.3)
Chloride: 95 mmol/L — ABNORMAL LOW (ref 96–106)
GFR, EST AFRICAN AMERICAN: 61 mL/min/{1.73_m2} (ref >59)
GFR, EST NON AFRICAN AMERICAN: 53 mL/min/{1.73_m2} — AB (ref >59)
GLUCOSE: 75 mg/dL (ref 65–99)
Potassium: 3.6 mmol/L (ref 3.5–5.2)
SODIUM: 140 mmol/L (ref 134–144)

## 2016-05-22 LAB — TSH: TSH: 4.18 u[IU]/mL (ref 0.450–4.500)

## 2016-05-24 ENCOUNTER — Encounter: Payer: Self-pay | Admitting: Family Medicine

## 2016-06-23 DIAGNOSIS — I10 Essential (primary) hypertension: Secondary | ICD-10-CM | POA: Diagnosis not present

## 2016-06-23 DIAGNOSIS — G309 Alzheimer's disease, unspecified: Secondary | ICD-10-CM | POA: Diagnosis not present

## 2016-06-23 DIAGNOSIS — E039 Hypothyroidism, unspecified: Secondary | ICD-10-CM | POA: Diagnosis not present

## 2016-06-24 DIAGNOSIS — G309 Alzheimer's disease, unspecified: Secondary | ICD-10-CM | POA: Diagnosis not present

## 2016-06-24 DIAGNOSIS — I10 Essential (primary) hypertension: Secondary | ICD-10-CM | POA: Diagnosis not present

## 2016-06-24 DIAGNOSIS — E039 Hypothyroidism, unspecified: Secondary | ICD-10-CM | POA: Diagnosis not present

## 2016-06-26 ENCOUNTER — Other Ambulatory Visit: Payer: Self-pay | Admitting: Family Medicine

## 2016-06-28 DIAGNOSIS — G309 Alzheimer's disease, unspecified: Secondary | ICD-10-CM | POA: Diagnosis not present

## 2016-06-28 DIAGNOSIS — I10 Essential (primary) hypertension: Secondary | ICD-10-CM | POA: Diagnosis not present

## 2016-06-28 DIAGNOSIS — E039 Hypothyroidism, unspecified: Secondary | ICD-10-CM | POA: Diagnosis not present

## 2016-07-08 ENCOUNTER — Telehealth: Payer: Self-pay

## 2016-07-08 DIAGNOSIS — G309 Alzheimer's disease, unspecified: Secondary | ICD-10-CM | POA: Diagnosis not present

## 2016-07-08 DIAGNOSIS — E039 Hypothyroidism, unspecified: Secondary | ICD-10-CM | POA: Diagnosis not present

## 2016-07-08 DIAGNOSIS — I10 Essential (primary) hypertension: Secondary | ICD-10-CM | POA: Diagnosis not present

## 2016-07-08 NOTE — Telephone Encounter (Signed)
Hospice is calling to speak with Hailey Taylor or her nurse in regards to patient's condition.  She states that the patient has developed a rash.  Please advise  [phone]

## 2016-07-09 ENCOUNTER — Other Ambulatory Visit: Payer: Self-pay

## 2016-07-09 DIAGNOSIS — E039 Hypothyroidism, unspecified: Secondary | ICD-10-CM | POA: Diagnosis not present

## 2016-07-09 DIAGNOSIS — I10 Essential (primary) hypertension: Secondary | ICD-10-CM | POA: Diagnosis not present

## 2016-07-09 DIAGNOSIS — G309 Alzheimer's disease, unspecified: Secondary | ICD-10-CM | POA: Diagnosis not present

## 2016-07-09 NOTE — Telephone Encounter (Signed)
Hospice is calling again to seek advise on patient's rash from Hailey Taylor.  They also state that she is in need of a refill for her Bactroban ointment.  Please advise  [phone]

## 2016-07-10 DIAGNOSIS — I10 Essential (primary) hypertension: Secondary | ICD-10-CM | POA: Diagnosis not present

## 2016-07-10 DIAGNOSIS — G309 Alzheimer's disease, unspecified: Secondary | ICD-10-CM | POA: Diagnosis not present

## 2016-07-10 DIAGNOSIS — E039 Hypothyroidism, unspecified: Secondary | ICD-10-CM | POA: Diagnosis not present

## 2016-07-10 MED ORDER — MUPIROCIN 2 % EX OINT: 1.0000 "application " | TOPICAL_OINTMENT | Freq: Four times a day (QID) | CUTANEOUS | 1 refills | Status: DC

## 2016-07-10 NOTE — Telephone Encounter (Signed)
See next note cream rx

## 2016-07-10 NOTE — Telephone Encounter (Signed)
Hospice physician ordered Lotrisone cream for the rash.  Meds ordered this encounter  Medications  . mupirocin ointment (BACTROBAN) 2 %    Sig: Apply 1 application topically 4 (four) times daily.    Dispense:  30 g    Refill:  1    Please hold on file for pt for when needed    Order Specific Question:   Supervising Provider    Answer:   Sherren MochaSHAW, EVA N [phone]]

## 2016-07-16 DIAGNOSIS — E039 Hypothyroidism, unspecified: Secondary | ICD-10-CM | POA: Diagnosis not present

## 2016-07-16 DIAGNOSIS — I10 Essential (primary) hypertension: Secondary | ICD-10-CM | POA: Diagnosis not present

## 2016-07-16 DIAGNOSIS — G309 Alzheimer's disease, unspecified: Secondary | ICD-10-CM | POA: Diagnosis not present

## 2016-07-22 DIAGNOSIS — I10 Essential (primary) hypertension: Secondary | ICD-10-CM | POA: Diagnosis not present

## 2016-07-22 DIAGNOSIS — E039 Hypothyroidism, unspecified: Secondary | ICD-10-CM | POA: Diagnosis not present

## 2016-07-22 DIAGNOSIS — G309 Alzheimer's disease, unspecified: Secondary | ICD-10-CM | POA: Diagnosis not present

## 2016-07-23 DIAGNOSIS — G309 Alzheimer's disease, unspecified: Secondary | ICD-10-CM | POA: Diagnosis not present

## 2016-07-23 DIAGNOSIS — I10 Essential (primary) hypertension: Secondary | ICD-10-CM | POA: Diagnosis not present

## 2016-07-23 DIAGNOSIS — E039 Hypothyroidism, unspecified: Secondary | ICD-10-CM | POA: Diagnosis not present

## 2016-07-30 DIAGNOSIS — E039 Hypothyroidism, unspecified: Secondary | ICD-10-CM | POA: Diagnosis not present

## 2016-07-30 DIAGNOSIS — I10 Essential (primary) hypertension: Secondary | ICD-10-CM | POA: Diagnosis not present

## 2016-07-30 DIAGNOSIS — G309 Alzheimer's disease, unspecified: Secondary | ICD-10-CM | POA: Diagnosis not present

## 2016-08-05 DIAGNOSIS — I10 Essential (primary) hypertension: Secondary | ICD-10-CM | POA: Diagnosis not present

## 2016-08-05 DIAGNOSIS — E039 Hypothyroidism, unspecified: Secondary | ICD-10-CM | POA: Diagnosis not present

## 2016-08-05 DIAGNOSIS — G309 Alzheimer's disease, unspecified: Secondary | ICD-10-CM | POA: Diagnosis not present

## 2016-08-06 DIAGNOSIS — I10 Essential (primary) hypertension: Secondary | ICD-10-CM | POA: Diagnosis not present

## 2016-08-06 DIAGNOSIS — E039 Hypothyroidism, unspecified: Secondary | ICD-10-CM | POA: Diagnosis not present

## 2016-08-06 DIAGNOSIS — G309 Alzheimer's disease, unspecified: Secondary | ICD-10-CM | POA: Diagnosis not present

## 2016-08-13 DIAGNOSIS — I10 Essential (primary) hypertension: Secondary | ICD-10-CM | POA: Diagnosis not present

## 2016-08-13 DIAGNOSIS — E039 Hypothyroidism, unspecified: Secondary | ICD-10-CM | POA: Diagnosis not present

## 2016-08-13 DIAGNOSIS — G309 Alzheimer's disease, unspecified: Secondary | ICD-10-CM | POA: Diagnosis not present

## 2016-08-18 ENCOUNTER — Emergency Department (HOSPITAL_COMMUNITY)

## 2016-08-18 ENCOUNTER — Encounter (HOSPITAL_COMMUNITY): Payer: Self-pay

## 2016-08-18 ENCOUNTER — Inpatient Hospital Stay (HOSPITAL_COMMUNITY)
Admission: EM | Admit: 2016-08-18 | Discharge: 2016-08-22 | DRG: 480 | Disposition: A | Discharge status: Skilled Nursing Facility | Attending: Internal Medicine | Admitting: Internal Medicine

## 2016-08-18 DIAGNOSIS — Z681 Body mass index (BMI) 19 or less, adult: Secondary | ICD-10-CM | POA: Diagnosis not present

## 2016-08-18 DIAGNOSIS — N183 Chronic kidney disease, stage 3 (moderate): Secondary | ICD-10-CM | POA: Diagnosis present

## 2016-08-18 DIAGNOSIS — E43 Unspecified severe protein-calorie malnutrition: Secondary | ICD-10-CM | POA: Diagnosis present

## 2016-08-18 DIAGNOSIS — E039 Hypothyroidism, unspecified: Secondary | ICD-10-CM | POA: Diagnosis present

## 2016-08-18 DIAGNOSIS — M6281 Muscle weakness (generalized): Secondary | ICD-10-CM | POA: Diagnosis not present

## 2016-08-18 DIAGNOSIS — Z7982 Long term (current) use of aspirin: Secondary | ICD-10-CM | POA: Diagnosis not present

## 2016-08-18 DIAGNOSIS — R278 Other lack of coordination: Secondary | ICD-10-CM | POA: Diagnosis not present

## 2016-08-18 DIAGNOSIS — D72829 Elevated white blood cell count, unspecified: Secondary | ICD-10-CM | POA: Diagnosis present

## 2016-08-18 DIAGNOSIS — Z66 Do not resuscitate: Secondary | ICD-10-CM | POA: Diagnosis present

## 2016-08-18 DIAGNOSIS — T148XXA Other injury of unspecified body region, initial encounter: Secondary | ICD-10-CM | POA: Diagnosis not present

## 2016-08-18 DIAGNOSIS — Z79899 Other long term (current) drug therapy: Secondary | ICD-10-CM | POA: Diagnosis not present

## 2016-08-18 DIAGNOSIS — K5900 Constipation, unspecified: Secondary | ICD-10-CM | POA: Diagnosis not present

## 2016-08-18 DIAGNOSIS — S72001A Fracture of unspecified part of neck of right femur, initial encounter for closed fracture: Secondary | ICD-10-CM | POA: Diagnosis not present

## 2016-08-18 DIAGNOSIS — M25551 Pain in right hip: Secondary | ICD-10-CM | POA: Diagnosis not present

## 2016-08-18 DIAGNOSIS — E569 Vitamin deficiency, unspecified: Secondary | ICD-10-CM | POA: Diagnosis not present

## 2016-08-18 DIAGNOSIS — Z87891 Personal history of nicotine dependence: Secondary | ICD-10-CM

## 2016-08-18 DIAGNOSIS — D649 Anemia, unspecified: Secondary | ICD-10-CM | POA: Diagnosis not present

## 2016-08-18 DIAGNOSIS — Z515 Encounter for palliative care: Secondary | ICD-10-CM | POA: Diagnosis present

## 2016-08-18 DIAGNOSIS — Z9181 History of falling: Secondary | ICD-10-CM | POA: Diagnosis not present

## 2016-08-18 DIAGNOSIS — D62 Acute posthemorrhagic anemia: Secondary | ICD-10-CM | POA: Diagnosis not present

## 2016-08-18 DIAGNOSIS — G8911 Acute pain due to trauma: Secondary | ICD-10-CM | POA: Diagnosis not present

## 2016-08-18 DIAGNOSIS — E46 Unspecified protein-calorie malnutrition: Secondary | ICD-10-CM | POA: Diagnosis not present

## 2016-08-18 DIAGNOSIS — N390 Urinary tract infection, site not specified: Secondary | ICD-10-CM | POA: Diagnosis not present

## 2016-08-18 DIAGNOSIS — S72141A Displaced intertrochanteric fracture of right femur, initial encounter for closed fracture: Secondary | ICD-10-CM | POA: Diagnosis not present

## 2016-08-18 DIAGNOSIS — I129 Hypertensive chronic kidney disease with stage 1 through stage 4 chronic kidney disease, or unspecified chronic kidney disease: Secondary | ICD-10-CM | POA: Diagnosis present

## 2016-08-18 DIAGNOSIS — W1830XA Fall on same level, unspecified, initial encounter: Secondary | ICD-10-CM | POA: Diagnosis present

## 2016-08-18 DIAGNOSIS — R52 Pain, unspecified: Secondary | ICD-10-CM | POA: Diagnosis not present

## 2016-08-18 DIAGNOSIS — R2689 Other abnormalities of gait and mobility: Secondary | ICD-10-CM | POA: Diagnosis not present

## 2016-08-18 DIAGNOSIS — G301 Alzheimer's disease with late onset: Secondary | ICD-10-CM | POA: Diagnosis present

## 2016-08-18 DIAGNOSIS — N3942 Incontinence without sensory awareness: Secondary | ICD-10-CM | POA: Diagnosis not present

## 2016-08-18 DIAGNOSIS — G309 Alzheimer's disease, unspecified: Secondary | ICD-10-CM | POA: Diagnosis not present

## 2016-08-18 DIAGNOSIS — F0280 Dementia in other diseases classified elsewhere without behavioral disturbance: Secondary | ICD-10-CM | POA: Diagnosis not present

## 2016-08-18 DIAGNOSIS — R262 Difficulty in walking, not elsewhere classified: Secondary | ICD-10-CM | POA: Diagnosis not present

## 2016-08-18 DIAGNOSIS — Z888 Allergy status to other drugs, medicaments and biological substances status: Secondary | ICD-10-CM | POA: Diagnosis not present

## 2016-08-18 DIAGNOSIS — I10 Essential (primary) hypertension: Secondary | ICD-10-CM | POA: Diagnosis not present

## 2016-08-18 DIAGNOSIS — W19XXXA Unspecified fall, initial encounter: Secondary | ICD-10-CM | POA: Diagnosis not present

## 2016-08-18 DIAGNOSIS — R54 Age-related physical debility: Secondary | ICD-10-CM | POA: Diagnosis not present

## 2016-08-18 DIAGNOSIS — Z96642 Presence of left artificial hip joint: Secondary | ICD-10-CM | POA: Diagnosis present

## 2016-08-18 DIAGNOSIS — Z419 Encounter for procedure for purposes other than remedying health state, unspecified: Secondary | ICD-10-CM

## 2016-08-18 DIAGNOSIS — S72141D Displaced intertrochanteric fracture of right femur, subsequent encounter for closed fracture with routine healing: Secondary | ICD-10-CM | POA: Diagnosis not present

## 2016-08-18 DIAGNOSIS — S728X9A Other fracture of unspecified femur, initial encounter for closed fracture: Secondary | ICD-10-CM | POA: Diagnosis not present

## 2016-08-18 DIAGNOSIS — E876 Hypokalemia: Secondary | ICD-10-CM | POA: Diagnosis present

## 2016-08-18 DIAGNOSIS — R2681 Unsteadiness on feet: Secondary | ICD-10-CM | POA: Diagnosis not present

## 2016-08-18 DIAGNOSIS — E860 Dehydration: Secondary | ICD-10-CM | POA: Diagnosis not present

## 2016-08-18 LAB — TYPE AND SCREEN
ABO/RH(D): A POS
Antibody Screen: NEGATIVE

## 2016-08-18 LAB — BASIC METABOLIC PANEL
Anion gap: 8 (ref 5–15)
BUN: 35 mg/dL — AB (ref 6–20)
CHLORIDE: 104 mmol/L (ref 101–111)
CO2: 25 mmol/L (ref 22–32)
CREATININE: 1 mg/dL (ref 0.44–1.00)
Calcium: 8.7 mg/dL — ABNORMAL LOW (ref 8.9–10.3)
GFR calc Af Amer: 55 mL/min — ABNORMAL LOW (ref >60)
GFR calc non Af Amer: 47 mL/min — ABNORMAL LOW (ref >60)
Glucose, Bld: 134 mg/dL — ABNORMAL HIGH (ref 65–99)
Potassium: 3.4 mmol/L — ABNORMAL LOW (ref 3.5–5.1)
SODIUM: 137 mmol/L (ref 135–145)

## 2016-08-18 LAB — CBC WITH DIFFERENTIAL/PLATELET
BASOS PCT: 0 %
Basophils Absolute: 0 10*3/uL (ref 0.0–0.1)
EOS PCT: 0 %
Eosinophils Absolute: 0 10*3/uL (ref 0.0–0.7)
HCT: 28.1 % — ABNORMAL LOW (ref 36.0–46.0)
Hemoglobin: 9.2 g/dL — ABNORMAL LOW (ref 12.0–15.0)
LYMPHS ABS: 1.3 10*3/uL (ref 0.7–4.0)
Lymphocytes Relative: 6 %
MCH: 29.1 pg (ref 26.0–34.0)
MCHC: 32.7 g/dL (ref 30.0–36.0)
MCV: 88.9 fL (ref 78.0–100.0)
MONO ABS: 1.3 10*3/uL — AB (ref 0.1–1.0)
Monocytes Relative: 6 %
Neutro Abs: 19.6 10*3/uL — ABNORMAL HIGH (ref 1.7–7.7)
Neutrophils Relative %: 88 %
PLATELETS: 265 10*3/uL (ref 150–400)
RBC: 3.16 MIL/uL — ABNORMAL LOW (ref 3.87–5.11)
RDW: 14.1 % (ref 11.5–15.5)
WBC: 22.2 10*3/uL — ABNORMAL HIGH (ref 4.0–10.5)

## 2016-08-18 LAB — APTT: APTT: 26 s (ref 24–36)

## 2016-08-18 LAB — PROTIME-INR
INR: 1.18
Prothrombin Time: 15 seconds (ref 11.4–15.2)

## 2016-08-18 MED ORDER — POTASSIUM CHLORIDE 20 MEQ/15ML (10%) PO SOLN
20.0000 meq | Freq: Once | ORAL | Status: AC
  Administered 2016-08-19: 20 meq via ORAL
  Filled 2016-08-18: qty 15

## 2016-08-18 MED ORDER — MORPHINE SULFATE (PF) 4 MG/ML IV SOLN
1.0000 mg | INTRAVENOUS | Status: DC | PRN
  Administered 2016-08-19 – 2016-08-20 (×3): 1 mg via INTRAVENOUS
  Filled 2016-08-18 (×3): qty 1

## 2016-08-18 MED ORDER — SODIUM CHLORIDE 0.9 % IV SOLN
INTRAVENOUS | Status: DC
  Administered 2016-08-19: via INTRAVENOUS

## 2016-08-18 MED ORDER — HYDROCHLOROTHIAZIDE 25 MG PO TABS
25.0000 mg | ORAL_TABLET | Freq: Every day | ORAL | Status: DC
  Administered 2016-08-20 – 2016-08-21 (×2): 25 mg via ORAL
  Filled 2016-08-18 (×3): qty 1

## 2016-08-18 MED ORDER — OXYCODONE-ACETAMINOPHEN 5-325 MG PO TABS: 1.0000 | ORAL_TABLET | ORAL | Status: DC | PRN

## 2016-08-18 MED ORDER — SODIUM CHLORIDE 0.9 % IV BOLUS (SEPSIS)
500.0000 mL | Freq: Once | INTRAVENOUS | Status: AC
Start: 2016-08-18 — End: 2016-08-18
  Administered 2016-08-18: 500 mL via INTRAVENOUS

## 2016-08-18 MED ORDER — ASPIRIN 81 MG PO CHEW: 81.0000 mg | CHEWABLE_TABLET | Freq: Every day | ORAL | Status: DC

## 2016-08-18 MED ORDER — FENTANYL CITRATE (PF) 100 MCG/2ML IJ SOLN
50.0000 ug | Freq: Once | INTRAMUSCULAR | Status: AC
  Administered 2016-08-18: 50 ug via INTRAVENOUS
  Filled 2016-08-18: qty 2

## 2016-08-18 MED ORDER — ONDANSETRON HCL 4 MG/2ML IJ SOLN
4.0000 mg | Freq: Three times a day (TID) | INTRAMUSCULAR | Status: DC | PRN
  Administered 2016-08-19: 4 mg via INTRAVENOUS
  Filled 2016-08-18: qty 2

## 2016-08-18 MED ORDER — MUPIROCIN 2 % EX OINT
1.0000 | TOPICAL_OINTMENT | Freq: Four times a day (QID) | CUTANEOUS | Status: DC
Start: 2016-08-18 — End: 2016-08-22
  Administered 2016-08-19 – 2016-08-22 (×7): 1 via TOPICAL
  Filled 2016-08-18 (×3): qty 22

## 2016-08-18 MED ORDER — POLYETHYLENE GLYCOL 3350 17 G PO PACK: 17.0000 g | PACK | Freq: Every day | ORAL | Status: DC | PRN

## 2016-08-18 MED ORDER — RIVASTIGMINE 9.5 MG/24HR TD PT24
9.5000 mg | MEDICATED_PATCH | Freq: Every day | TRANSDERMAL | Status: DC
  Administered 2016-08-20 – 2016-08-22 (×3): 9.5 mg via TRANSDERMAL
  Filled 2016-08-18 (×4): qty 1

## 2016-08-18 MED ORDER — HYDRALAZINE HCL 20 MG/ML IJ SOLN: 5.0000 mg | INTRAMUSCULAR | Status: DC | PRN

## 2016-08-18 MED ORDER — ZOLPIDEM TARTRATE 5 MG PO TABS
5.0000 mg | ORAL_TABLET | Freq: Every evening | ORAL | Status: DC | PRN
Start: 2016-08-18 — End: 2016-08-22

## 2016-08-18 MED ORDER — LEVOTHYROXINE SODIUM 50 MCG PO TABS
50.0000 ug | ORAL_TABLET | Freq: Every day | ORAL | Status: DC
  Administered 2016-08-20 – 2016-08-22 (×3): 50 ug via ORAL
  Filled 2016-08-18 (×3): qty 1

## 2016-08-18 MED ORDER — METHOCARBAMOL 500 MG PO TABS
500.0000 mg | ORAL_TABLET | Freq: Three times a day (TID) | ORAL | Status: DC | PRN
  Administered 2016-08-20 – 2016-08-22 (×3): 500 mg via ORAL
  Filled 2016-08-18 (×3): qty 1

## 2016-08-18 MED ORDER — VITAMIN D 1000 UNITS PO TABS
5000.0000 [IU] | ORAL_TABLET | Freq: Every day | ORAL | Status: DC
  Administered 2016-08-20 – 2016-08-22 (×3): 5000 [IU] via ORAL
  Filled 2016-08-18 (×4): qty 5

## 2016-08-18 NOTE — H&P (Addendum)
History and Physical    Hailey Taylor ZOX:[id number] DOB: April 05, 1922 DOA: 08/18/2016  Referring MD/NP/PA:   PCP: No primary care provider on file.   Patient coming from:  The patient is coming from home.  At baseline, pt is dependent for most of ADL.   Chief Complaint: right hip pain after fall  HPI: Hailey Taylor is a 83 y.o. female with medical history significant of dementia, hypertension, hypothyroidism, chronic kidney disease-stage III, under hospice care currently, who presents with right hip pain after fall.  Per pt's grandson, pt fell when she was trying to sit in a chair and missed. No LOC. She injured her right hip and developed severe pain in the right hip. Her right leg is externally rotated. Per her grandson, patient does not seem to have chest pain, cough, shortness of breath, nausea, vomiting, diarrhea or symptoms of UTI.  ED Course: pt was found to have potassium is 3.4, creatinine 1.0, WBC 22.2. negative CT head and C-spine for acute bony abnormalities. Negative chest x-ray for acute issues. X-ray of right hip showed acute displaced RIGHT femur intertrochanteric fracture. Ortho, dr. Luiz Blare was consulted.  Review of Systems:   General: no fevers, chills, no changes in body weight, has fatigue HEENT: no blurry vision, hearing changes or sore throat Respiratory: no dyspnea, coughing, wheezing CV: no chest pain, no palpitations GI: no nausea, vomiting, abdominal pain, diarrhea, constipation GU: no dysuria, burning on urination, increased urinary frequency, hematuria  Ext: no leg edema Neuro: no unilateral weakness, numbness, or tingling, no vision change or hearing loss Skin: no rash, no skin tear. MSK: has right hip pain Heme: No easy bruising.  Travel history: No recent long distant travel.  Allergy:  Allergies  Allergen Reactions  . Lisinopril Other (See Comments)    Causes acute renal failure    Past Medical History:  Diagnosis Date  . Hypertension   .  Thyroid disease     Past Surgical History:  Procedure Laterality Date  . TOTAL HIP ARTHROPLASTY      Social History:  reports that she has quit smoking. She does not have any smokeless tobacco history on file. She reports that she drinks alcohol. She reports that she does not use drugs.  Family History: Could not be reviewed accurately due to dementia  Prior to Admission medications   Medication Sig Start Date End Date Taking? Authorizing Provider  aspirin 81 MG tablet Take 81 mg by mouth daily.   Yes Historical Provider, MD  Cholecalciferol (VITAMIN D3) 5000 units CAPS Take 1 capsule by mouth daily.   Yes Historical Provider, MD  EXELON 9.5 MG/24HR PLACE 1 PATCH (9.5 MG TOTAL) ONTO THE SKIN DAILY 01/13/16  Yes Sherren Mocha, MD  hydrochlorothiazide (HYDRODIURIL) 25 MG tablet TAKE 1 TABLET (25 MG TOTAL) BY MOUTH DAILY. 06/28/16  Yes Sherren Mocha, MD  levothyroxine (SYNTHROID, LEVOTHROID) 50 MCG tablet TAKE 1 TABLET BY MOUTH DAILY BEFORE BREAKFAST 05/15/16  Yes Gwenlyn Found Copland, MD  mupirocin ointment (BACTROBAN) 2 % Apply 1 application topically 4 (four) times daily. 07/10/16  Yes Chelle Jeffery, PA-C  OVER THE COUNTER MEDICATION    Yes Historical Provider, MD  Potassium Bicarbonate 99 MG CAPS Take 99 mEq by mouth 1 day or 1 dose.   Yes Historical Provider, MD    Physical Exam: Vitals:   08/18/16 2118 08/18/16 2216  BP: 100/88 92/56  Pulse: 68 73  Resp: 16 18  SpO2: 97%    General: Not in  acute distress HEENT:       Eyes: PERRL, EOMI, no scleral icterus.       ENT: No discharge from the ears and nose, no pharynx injection, no tonsillar enlargement.        Neck: No JVD, no bruit, no mass felt. Heme: No neck lymph node enlargement. Cardiac: S1/S2, RRR, No murmurs, No gallops or rubs. Respiratory: No rales, wheezing, rhonchi or rubs. GI: Soft, nondistended, nontender, no rebound pain, no organomegaly, BS present. GU: No hematuria Ext: No pitting leg edema bilaterally. 2+DP/PT pulse  bilaterally. Musculoskeletal: has severe tenderness in right hip, the right leg is externally rotated. Skin: No rashes.  Neuro: Alert, oriented X3, cranial nerves II-XII grossly intact, moves all extremities. Psych: Patient is not psychotic.  Labs on Admission: I have personally reviewed following labs and imaging studies  CBC:  Recent Labs Lab 08/18/16 2237  WBC 22.2*  NEUTROABS PENDING  HGB 9.2*  HCT 28.1*  MCV 88.9  PLT 265   Basic Metabolic Panel:  Recent Labs Lab 08/18/16 2237  NA 137  K 3.4*  CL 104  CO2 25  GLUCOSE 134*  BUN 35*  CREATININE 1.00  CALCIUM 8.7*   GFR: CrCl cannot be calculated (Unknown ideal weight.). Liver Function Tests: No results for input(s): AST, ALT, ALKPHOS, BILITOT, PROT, ALBUMIN in the last 168 hours. No results for input(s): LIPASE, AMYLASE in the last 168 hours. No results for input(s): AMMONIA in the last 168 hours. Coagulation Profile: No results for input(s): INR, PROTIME in the last 168 hours. Cardiac Enzymes: No results for input(s): CKTOTAL, CKMB, CKMBINDEX, TROPONINI in the last 168 hours. BNP (last 3 results) No results for input(s): PROBNP in the last 8760 hours. HbA1C: No results for input(s): HGBA1C in the last 72 hours. CBG: No results for input(s): GLUCAP in the last 168 hours. Lipid Profile: No results for input(s): CHOL, HDL, LDLCALC, TRIG, CHOLHDL, LDLDIRECT in the last 72 hours. Thyroid Function Tests: No results for input(s): TSH, T4TOTAL, FREET4, T3FREE, THYROIDAB in the last 72 hours. Anemia Panel: No results for input(s): VITAMINB12, FOLATE, FERRITIN, TIBC, IRON, RETICCTPCT in the last 72 hours. Urine analysis:    Component Value Date/Time   BILIRUBINUR negative 01/09/2016 1545   KETONESUR negative 01/09/2016 1545   PROTEINUR negative 01/09/2016 1545   UROBILINOGEN 0.2 01/09/2016 1545   NITRITE Negative 01/09/2016 1545   LEUKOCYTESUR Trace (A) 01/09/2016 1545   Sepsis  Labs: @LABRCNTIP (procalcitonin:4,lacticidven:4) )No results found for this or any previous visit (from the past 240 hour(s)).   Radiological Exams on Admission: Ct Head Wo Contrast  Result Date: 08/18/2016 CLINICAL DATA:  Larey Seat at home when she missed the chair she was trying to sit on. EXAM: CT HEAD WITHOUT CONTRAST CT CERVICAL SPINE WITHOUT CONTRAST TECHNIQUE: Multidetector CT imaging of the head and cervical spine was performed following the standard protocol without intravenous contrast. Multiplanar CT image reconstructions of the cervical spine were also generated. COMPARISON:  None. FINDINGS: CT HEAD FINDINGS Brain: There is no intracranial hemorrhage, mass or evidence of acute infarction. There is remote lacunar infarction in the left centrum semiovale, left caudate, left putaminal and right insula. There is moderate generalized atrophy. There is moderate chronic microvascular ischemic change. There is no significant extra-axial fluid collection. No acute intracranial findings are evident. Vascular: No hyperdense vessel or unexpected calcification. Skull: Normal. Negative for fracture or focal lesion. Sinuses/Orbits: No acute finding. Other: None. CT CERVICAL SPINE FINDINGS Alignment: Normal. Skull base and vertebrae: No acute fracture. No  primary bone lesion or focal pathologic process. Soft tissues and spinal canal: No prevertebral fluid or swelling. No visible canal hematoma. Disc levels: Good preservation of intervertebral disc spaces with mild cervical degenerative disc disease at C5-6 and C6-7. Facet articulations are mildly arthritic, otherwise intact. Upper chest: Negative Other: Atherosclerotic calcification of the left vertebral artery. IMPRESSION: 1. No acute intracranial findings. There is moderate generalized atrophy and chronic appearing white matter hypodensities which likely represent small vessel ischemic disease. Multiple remote lacunar infarctions. 2. Negative for acute cervical spine  fracture Electronically Signed   By: Ellery Plunk M.D.   On: 08/18/2016 22:36   Ct Cervical Spine Wo Contrast  Result Date: 08/18/2016 CLINICAL DATA:  Larey Seat at home when she missed the chair she was trying to sit on. EXAM: CT HEAD WITHOUT CONTRAST CT CERVICAL SPINE WITHOUT CONTRAST TECHNIQUE: Multidetector CT imaging of the head and cervical spine was performed following the standard protocol without intravenous contrast. Multiplanar CT image reconstructions of the cervical spine were also generated. COMPARISON:  None. FINDINGS: CT HEAD FINDINGS Brain: There is no intracranial hemorrhage, mass or evidence of acute infarction. There is remote lacunar infarction in the left centrum semiovale, left caudate, left putaminal and right insula. There is moderate generalized atrophy. There is moderate chronic microvascular ischemic change. There is no significant extra-axial fluid collection. No acute intracranial findings are evident. Vascular: No hyperdense vessel or unexpected calcification. Skull: Normal. Negative for fracture or focal lesion. Sinuses/Orbits: No acute finding. Other: None. CT CERVICAL SPINE FINDINGS Alignment: Normal. Skull base and vertebrae: No acute fracture. No primary bone lesion or focal pathologic process. Soft tissues and spinal canal: No prevertebral fluid or swelling. No visible canal hematoma. Disc levels: Good preservation of intervertebral disc spaces with mild cervical degenerative disc disease at C5-6 and C6-7. Facet articulations are mildly arthritic, otherwise intact. Upper chest: Negative Other: Atherosclerotic calcification of the left vertebral artery. IMPRESSION: 1. No acute intracranial findings. There is moderate generalized atrophy and chronic appearing white matter hypodensities which likely represent small vessel ischemic disease. Multiple remote lacunar infarctions. 2. Negative for acute cervical spine fracture Electronically Signed   By: Ellery Plunk M.D.   On:  08/18/2016 22:36   Dg Chest Portable 1 View  Result Date: 08/18/2016 CLINICAL DATA:  Right hip fracture EXAM: PORTABLE CHEST 1 VIEW COMPARISON:  None. FINDINGS: The lungs are grossly clear. Mild left hemidiaphragm elevation. No large effusion. No pneumothorax. Normal mediastinal contours. Normal pulmonary vasculature. No displaced fractures. IMPRESSION: No acute cardiopulmonary findings. Electronically Signed   By: Ellery Plunk M.D.   On: 08/18/2016 23:04   Dg Hip Unilat W Or Wo Pelvis 2-3 Views Right  Result Date: 08/18/2016 CLINICAL DATA:  RIGHT hip pain after fall tonight. History of dementia. EXAM: DG HIP (WITH OR WITHOUT PELVIS) 2-3V RIGHT COMPARISON:  None. FINDINGS: Acute comminuted RIGHT intertrochanteric fracture with impaction, slight varus angulation distal bony fragments. No dislocation. Status post LEFT hip hemiarthroplasty. Osteopenia without destructive bony lesions. Stool distended rectum. Moderate vascular calcifications. IMPRESSION: Acute displaced RIGHT femur intertrochanteric fracture. Electronically Signed   By: Awilda Metro M.D.   On: 08/18/2016 22:13     EKG: Not done in ED, will get one.   Assessment/Plan Principal Problem:   Closed fracture of right hip [facility]) Active Problems:   Hypertension   Hypothyroidism   Late onset Alzheimer's disease without behavioral disturbance   Fall   Normocytic anemia   Hypokalemia   Closed fracture of right hip (HCC): As  evidenced by x-ray. Patient has severe pain now. No neurovascular compromise. Orthopedic surgeon, dr. Luiz BlareGraves was consulted-->possible ORIF tomorrow  - will admit to Med-surg bed as inpt - Pain control: morphine prn and percocet - When necessary Zofran for nausea - Robaxin for muscle spasm - type and cross - INR/PTT  Fall: seems to be mechanical fall. -pt/ot when able to  HTN: -continue HCTZ -prn IV hydralazine  Hypothyroidism: Last TSH was 4.180 -Continue home Synthroid  Late onset  Alzheimer's disease without behavioral disturbance: -continue Exlon  Hypokalemia: K= 3.4  on admission. - Repleted  Normocytic anemia: Hgb 9.2 -check anemia panel  Leukocytosis: no signs of infection. Likely due to stress induced to demargination. -follow up by CBC -f/u UA   DVT ppx: SCD Code Status: DNR  Family Communication: Yes, patient's daughter on the phone and grandson at bed side Disposition Plan:  Anticipate discharge back to rehabilitation facility environment Consults called:  Ortho, Dr. Luiz BlareGraves Admission status: medical floor/obs   Date of Service 08/18/2016    Lorretta HarpIU, Jaramie Bastos [facility] Pager [phone]  If 7PM-7AM, please contact night-coverage [email or web address] Password [email or web address] 08/18/2016, 11:22 PM

## 2016-08-18 NOTE — ED Triage Notes (Signed)
Pt coming from home by ems after having a fall. Pt was trying to sit in a chair and miss. Fell on her right side. Outer rotation on the right legs noted and hip. Pt have hx of demitia. Pt received 100 mcg of fentanyl given by ems.

## 2016-08-18 NOTE — ED Notes (Signed)
Bed: [id number] Expected date:  Expected time:  Means of arrival:  Comments: 83 yo f fall

## 2016-08-18 NOTE — Progress Notes (Signed)
Patient ID: Hailey Taylor, female   DOB: 03/16/1922, [age] y.o.   MRN: [id number] I have reviewed the patients x-rays and she needs ORIF r hip for intertroch fx.  I will evaluate the patient in the AM.  [facility] no [facility] is available in the afternoon tomorrow at [facility].

## 2016-08-18 NOTE — Consult Note (Signed)
Reason for Consult:intertroch fracture Referring Physician: hospitalists  Hailey Taylor is an 83 y.o. female.  HPI: 83 yo somewhat demented female who fell today and presents with R intertroch fx.  She is admitted and cleared by medicine for surgery and transferred to [facility] because no OR time available tomorrow at [place].  Past Medical History:  Diagnosis Date  . Hypertension   . Thyroid disease     Past Surgical History:  Procedure Laterality Date  . TOTAL HIP ARTHROPLASTY      No family history on file.  Social History:  reports that she has quit smoking. She does not have any smokeless tobacco history on file. She reports that she drinks alcohol. She reports that she does not use drugs.  Allergies:  Allergies  Allergen Reactions  . Lisinopril Other (See Comments)    Causes acute renal failure    Medications: I have reviewed the patient's current medications.  No results found for this or any previous visit (from the past 48 hour(s)).  Ct Head Wo Contrast  Result Date: 08/18/2016 CLINICAL DATA:  [place] at home when she missed the chair she was trying to sit on. EXAM: CT HEAD WITHOUT CONTRAST CT CERVICAL SPINE WITHOUT CONTRAST TECHNIQUE: Multidetector CT imaging of the head and cervical spine was performed following the standard protocol without intravenous contrast. Multiplanar CT image reconstructions of the cervical spine were also generated. COMPARISON:  None. FINDINGS: CT HEAD FINDINGS Brain: There is no intracranial hemorrhage, mass or evidence of acute infarction. There is remote lacunar infarction in the left centrum semiovale, left caudate, left putaminal and right insula. There is moderate generalized atrophy. There is moderate chronic microvascular ischemic change. There is no significant extra-axial fluid collection. No acute intracranial findings are evident. Vascular: No hyperdense vessel or unexpected calcification. Skull: Normal. Negative for fracture or focal lesion.  Sinuses/Orbits: No acute finding. Other: None. CT CERVICAL SPINE FINDINGS Alignment: Normal. Skull base and vertebrae: No acute fracture. No primary bone lesion or focal pathologic process. Soft tissues and spinal canal: No prevertebral fluid or swelling. No visible canal hematoma. Disc levels: Good preservation of intervertebral disc spaces with mild cervical degenerative disc disease at C5-6 and C6-7. Facet articulations are mildly arthritic, otherwise intact. Upper chest: Negative Other: Atherosclerotic calcification of the left vertebral artery. IMPRESSION: 1. No acute intracranial findings. There is moderate generalized atrophy and chronic appearing white matter hypodensities which likely represent small vessel ischemic disease. Multiple remote lacunar infarctions. 2. Negative for acute cervical spine fracture Electronically Signed   By: Andreas Newport M.D.   On: 08/18/2016 22:36   Ct Cervical Spine Wo Contrast  Result Date: 08/18/2016 CLINICAL DATA:  [place] at home when she missed the chair she was trying to sit on. EXAM: CT HEAD WITHOUT CONTRAST CT CERVICAL SPINE WITHOUT CONTRAST TECHNIQUE: Multidetector CT imaging of the head and cervical spine was performed following the standard protocol without intravenous contrast. Multiplanar CT image reconstructions of the cervical spine were also generated. COMPARISON:  None. FINDINGS: CT HEAD FINDINGS Brain: There is no intracranial hemorrhage, mass or evidence of acute infarction. There is remote lacunar infarction in the left centrum semiovale, left caudate, left putaminal and right insula. There is moderate generalized atrophy. There is moderate chronic microvascular ischemic change. There is no significant extra-axial fluid collection. No acute intracranial findings are evident. Vascular: No hyperdense vessel or unexpected calcification. Skull: Normal. Negative for fracture or focal lesion. Sinuses/Orbits: No acute finding. Other: None. CT CERVICAL SPINE  FINDINGS Alignment:  Normal. Skull base and vertebrae: No acute fracture. No primary bone lesion or focal pathologic process. Soft tissues and spinal canal: No prevertebral fluid or swelling. No visible canal hematoma. Disc levels: Good preservation of intervertebral disc spaces with mild cervical degenerative disc disease at C5-6 and C6-7. Facet articulations are mildly arthritic, otherwise intact. Upper chest: Negative Other: Atherosclerotic calcification of the left vertebral artery. IMPRESSION: 1. No acute intracranial findings. There is moderate generalized atrophy and chronic appearing white matter hypodensities which likely represent small vessel ischemic disease. Multiple remote lacunar infarctions. 2. Negative for acute cervical spine fracture Electronically Signed   By: Andreas Newport M.D.   On: 08/18/2016 22:36   Dg Hip Unilat W Or Wo Pelvis 2-3 Views Right  Result Date: 08/18/2016 CLINICAL DATA:  RIGHT hip pain after fall tonight. History of dementia. EXAM: DG HIP (WITH OR WITHOUT PELVIS) 2-3V RIGHT COMPARISON:  None. FINDINGS: Acute comminuted RIGHT intertrochanteric fracture with impaction, slight varus angulation distal bony fragments. No dislocation. Status post LEFT hip hemiarthroplasty. Osteopenia without destructive bony lesions. Stool distended rectum. Moderate vascular calcifications. IMPRESSION: Acute displaced RIGHT femur intertrochanteric fracture. Electronically Signed   By: Elon Alas M.D.   On: 08/18/2016 22:13    ROS  ROS: I have reviewed the patient's review of systems thoroughly and there are no positive responses as relates to the HPI. Blood pressure 100/88, pulse 68, resp. rate 16, SpO2 97 %. Physical Exam Well-developed well-nourished patient in no acute distress. Alert and oriented x3 HEENT:within normal limits Cardiac: Regular rate and rhythm Pulmonary: Lungs clear to auscultation Abdomen: Soft and nontender.  Normal active bowel sounds  Musculoskeletal:  (R hip: externally rotated and shortened pain with ROM NVI distally)  Recent Results (from the past 2160 hour(s))  CBC     Status: None   Collection Time: 05/21/16  3:32 PM  Result Value Ref Range   WBC 7.3 3.4 - 10.8 x10E3/uL   RBC 4.09 3.77 - 5.28 x10E6/uL   Hemoglobin 12.2 11.1 - 15.9 g/dL    Comment:               **Please note reference interval change**   Hematocrit 36.0 34.0 - 46.6 %   MCV 88 79 - 97 fL   MCH 29.8 26.6 - 33.0 pg   MCHC 33.9 31.5 - 35.7 g/dL   RDW 14.2 12.3 - 15.4 %   Platelets 322 150 - 379 x10E3/uL  TSH     Status: None   Collection Time: 05/21/16  3:32 PM  Result Value Ref Range   TSH 4.180 0.450 - 4.500 uIU/mL  Basic metabolic panel     Status: Abnormal   Collection Time: 05/21/16  3:32 PM  Result Value Ref Range   Glucose 75 65 - 99 mg/dL   BUN 25 10 - 36 mg/dL   Creatinine, Ser 0.94 0.57 - 1.00 mg/dL   GFR calc non Af Amer 53 (L) >59 mL/min/1.73   GFR calc Af Amer 61 >59 mL/min/1.73   BUN/Creatinine Ratio 27 12 - 28   Sodium 140 134 - 144 mmol/L   Potassium 3.6 3.5 - 5.2 mmol/L   Chloride 95 (L) 96 - 106 mmol/L   CO2 28 18 - 29 mmol/L   Calcium 9.7 8.7 - 10.3 mg/dL   Recent Results (from the past 2160 hour(s))  CBC     Status: None   Collection Time: 05/21/16  3:32 PM  Result Value Ref Range   WBC 7.3 3.4 -  10.8 x10E3/uL   RBC 4.09 3.77 - 5.28 x10E6/uL   Hemoglobin 12.2 11.1 - 15.9 g/dL    Comment:               **Please note reference interval change**   Hematocrit 36.0 34.0 - 46.6 %   MCV 88 79 - 97 fL   MCH 29.8 26.6 - 33.0 pg   MCHC 33.9 31.5 - 35.7 g/dL   RDW 14.2 12.3 - 15.4 %   Platelets 322 150 - 379 x10E3/uL  TSH     Status: None   Collection Time: 05/21/16  3:32 PM  Result Value Ref Range   TSH 4.180 0.450 - 4.500 uIU/mL  Basic metabolic panel     Status: Abnormal   Collection Time: 05/21/16  3:32 PM  Result Value Ref Range   Glucose 75 65 - 99 mg/dL   BUN 25 10 - 36 mg/dL   Creatinine, Ser 0.94 0.57 - 1.00 mg/dL   GFR  calc non Af Amer 53 (L) >59 mL/min/1.73   GFR calc Af Amer 61 >59 mL/min/1.73   BUN/Creatinine Ratio 27 12 - 28   Sodium 140 134 - 144 mmol/L   Potassium 3.6 3.5 - 5.2 mmol/L   Chloride 95 (L) 96 - 106 mmol/L   CO2 28 18 - 29 mmol/L   Calcium 9.7 8.7 - 10.3 mg/dL  Basic metabolic panel     Status: Abnormal   Collection Time: 08/18/16 10:37 PM  Result Value Ref Range   Sodium 137 135 - 145 mmol/L   Potassium 3.4 (L) 3.5 - 5.1 mmol/L   Chloride 104 101 - 111 mmol/L   CO2 25 22 - 32 mmol/L   Glucose, Bld 134 (H) 65 - 99 mg/dL   BUN 35 (H) 6 - 20 mg/dL   Creatinine, Ser 1.00 0.44 - 1.00 mg/dL   Calcium 8.7 (L) 8.9 - 10.3 mg/dL   GFR calc non Af Amer 47 (L) >60 mL/min   GFR calc Af Amer 55 (L) >60 mL/min    Comment: (NOTE) The eGFR has been calculated using the CKD EPI equation. This calculation has not been validated in all clinical situations. eGFR's persistently <60 mL/min signify possible Chronic Kidney Disease.    Anion gap 8 5 - 15  CBC with Differential     Status: Abnormal   Collection Time: 08/18/16 10:37 PM  Result Value Ref Range   WBC 22.2 (H) 4.0 - 10.5 K/uL   RBC 3.16 (L) 3.87 - 5.11 MIL/uL   Hemoglobin 9.2 (L) 12.0 - 15.0 g/dL   HCT 28.1 (L) 36.0 - 46.0 %   MCV 88.9 78.0 - 100.0 fL   MCH 29.1 26.0 - 34.0 pg   MCHC 32.7 30.0 - 36.0 g/dL   RDW 14.1 11.5 - 15.5 %   Platelets 265 150 - 400 K/uL   Neutrophils Relative % 88 %   Lymphocytes Relative 6 %   Monocytes Relative 6 %   Eosinophils Relative 0 %   Basophils Relative 0 %   Neutro Abs 19.6 (H) 1.7 - 7.7 K/uL   Lymphs Abs 1.3 0.7 - 4.0 K/uL   Monocytes Absolute 1.3 (H) 0.1 - 1.0 K/uL   Eosinophils Absolute 0.0 0.0 - 0.7 K/uL   Basophils Absolute 0.0 0.0 - 0.1 K/uL   Smear Review MORPHOLOGY UNREMARKABLE   Protime-INR     Status: None   Collection Time: 08/18/16 10:37 PM  Result Value Ref Range   Prothrombin Time 15.0  11.4 - 15.2 seconds   INR 1.18   Type and screen [place]      Status: None   Collection Time: 08/18/16 10:37 PM  Result Value Ref Range   ABO/RH(D) A POS    Antibody Screen NEG    Sample Expiration 08/21/2016   ABO/Rh     Status: None   Collection Time: 08/18/16 10:37 PM  Result Value Ref Range   ABO/RH(D) A POS   APTT     Status: None   Collection Time: 08/18/16 11:06 PM  Result Value Ref Range   aPTT 26 24 - 36 seconds  MRSA PCR Screening     Status: None   Collection Time: 08/19/16  4:38 AM  Result Value Ref Range   MRSA by PCR NEGATIVE NEGATIVE    Comment:        The GeneXpert MRSA Assay (FDA approved for NASAL specimens only), is one component of a comprehensive MRSA colonization surveillance program. It is not intended to diagnose MRSA infection nor to guide or monitor treatment for MRSA infections.   Type and screen [place]     Status: None   Collection Time: 08/19/16  8:30 AM  Result Value Ref Range   ABO/RH(D) A POS    Antibody Screen NEG    Sample Expiration 08/22/2016   ABO/Rh     Status: None (Preliminary result)   Collection Time: 08/19/16  8:30 AM  Result Value Ref Range   ABO/RH(D) A POS    Assessment/Plan: 83 yo somewhat demented female who fell and suffered an intertroch fracture.  Pt c/o pain and inability to stand. Pt will need medical clearance and I will plan on ORIF of hip fracture tomorrow afternoon and pt needs to be done at [facility] as no time available at [place] long. Family is aware of significant risk  And benefit of surgery with risk of bleeding, infection, need for further surgery and death during or in peri-operative period and wish to proceed in face of this risk  Savan Ruta L 08/18/2016, 10:54 PM

## 2016-08-18 NOTE — ED Provider Notes (Signed)
Emergency Department Provider Note   I have reviewed the triage vital signs and the nursing notes.   HISTORY  Chief Complaint Fall   HPI Hailey Taylor is a 83 y.o. female with HTN and thyroid disease presents to the ED for evaluation of right hip pain after fall. The fall was at home. She was apparently trying to sit down and missed the chair.   Level 5 caveat: Dementia.   Past Medical History:  Diagnosis Date  . Hypertension   . Thyroid disease     Patient Active Problem List   Diagnosis Date Noted  . Closed fracture of right hip (HCC) 08/18/2016  . Fall 08/18/2016  . Normocytic anemia 08/18/2016  . Hypokalemia 08/18/2016  . Leukocytosis 08/18/2016  . 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 Dehydration 11/25/2015  . Urinary incontinence without sensory awareness 11/25/2015  . Late onset Alzheimer's disease without behavioral disturbance 07/10/2015  . 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 Hypertension 10/08/2011  . Memory loss 10/08/2011  . Hypothyroidism 10/08/2011  . History of fracture of hip 10/18/2010    Past Surgical History:  Procedure Laterality Date  . TOTAL HIP ARTHROPLASTY      Current Outpatient Rx  . Order #: [id number] Class: Historical Med  . Order #: [id number] Class: Historical Med  . Order #: [id number] Class: Normal  . Order #: [id number] Class: Normal  . Order #: [id number] Class: Normal  . Order #: [id number] Class: Normal  . Order #: [id number] Class: Historical Med  . Order #: [id number] Class: Historical Med    Allergies Lisinopril  No family history on file.  Social History Social History  Substance Use Topics  . Smoking status: Former Games developer  . Smokeless tobacco: Not on file  . Alcohol use 0.0 oz/week    Review of Systems  Level 5 caveat: Dementia.   ____________________________________________   PHYSICAL EXAM:  VITAL SIGNS: ED Triage Vitals  Enc Vitals Group     BP 08/18/16 2118 100/88     Pulse  Rate 08/18/16 2118 68     Resp 08/18/16 2118 16     Temp --      Temp src --      SpO2 08/18/16 2118 97 %     Pain Score 08/18/16 2107 8   Constitutional: Alert but confused.  Eyes: Conjunctivae are normal.  Head: Atraumatic. Nose: No congestion/rhinnorhea. Mouth/Throat: Mucous membranes are moist.   Neck: No stridor.  No cervical spine tenderness to palpation. Cardiovascular: Normal rate, regular rhythm. Good peripheral circulation. Grossly normal heart sounds.   Respiratory: Normal respiratory effort.  No retractions. Lungs CTAB. Gastrointestinal: Soft and nontender. No distention.  Musculoskeletal: No lower extremity edema. RLE is shortened and externally rotated.  Neurologic:   No gross focal neurologic deficits are appreciated.  Skin:  Skin is warm, dry and intact. No rash noted.  ____________________________________________   LABS (all labs ordered are listed, but only abnormal results are displayed)  Labs Reviewed  BASIC METABOLIC PANEL - Abnormal; Notable for the following:       Result Value   Potassium 3.4 (*)    Glucose, Bld 134 (*)    BUN 35 (*)    Calcium 8.7 (*)    GFR calc non Af Amer 47 (*)    GFR calc Af Amer 55 (*)    All other components within normal limits  CBC WITH DIFFERENTIAL/PLATELET - Abnormal; Notable for the following:    WBC 22.2 (*)    RBC 3.16 (*)  Hemoglobin 9.2 (*)    HCT 28.1 (*)    Neutro Abs 19.6 (*)    Monocytes Absolute 1.3 (*)    All other components within normal limits  PROTIME-INR  APTT  VITAMIN B12  FOLATE  IRON AND TIBC  FERRITIN  RETICULOCYTES  TYPE AND SCREEN  ABO/RH   ____________________________________________  RADIOLOGY  Ct Head Wo Contrast  Result Date: 08/18/2016 CLINICAL DATA:  [place] at home when she missed the chair she was trying to sit on. EXAM: CT HEAD WITHOUT CONTRAST CT CERVICAL SPINE WITHOUT CONTRAST TECHNIQUE: Multidetector CT imaging of the head and cervical spine was performed following the  standard protocol without intravenous contrast. Multiplanar CT image reconstructions of the cervical spine were also generated. COMPARISON:  None. FINDINGS: CT HEAD FINDINGS Brain: There is no intracranial hemorrhage, mass or evidence of acute infarction. There is remote lacunar infarction in the left centrum semiovale, left caudate, left putaminal and right insula. There is moderate generalized atrophy. There is moderate chronic microvascular ischemic change. There is no significant extra-axial fluid collection. No acute intracranial findings are evident. Vascular: No hyperdense vessel or unexpected calcification. Skull: Normal. Negative for fracture or focal lesion. Sinuses/Orbits: No acute finding. Other: None. CT CERVICAL SPINE FINDINGS Alignment: Normal. Skull base and vertebrae: No acute fracture. No primary bone lesion or focal pathologic process. Soft tissues and spinal canal: No prevertebral fluid or swelling. No visible canal hematoma. Disc levels: Good preservation of intervertebral disc spaces with mild cervical degenerative disc disease at C5-6 and C6-7. Facet articulations are mildly arthritic, otherwise intact. Upper chest: Negative Other: Atherosclerotic calcification of the left vertebral artery. IMPRESSION: 1. No acute intracranial findings. There is moderate generalized atrophy and chronic appearing white matter hypodensities which likely represent small vessel ischemic disease. Multiple remote lacunar infarctions. 2. Negative for acute cervical spine fracture Electronically Signed   By: Ellery Plunk M.D.   On: 08/18/2016 22:36   Ct Cervical Spine Wo Contrast  Result Date: 08/18/2016 CLINICAL DATA:  [place] at home when she missed the chair she was trying to sit on. EXAM: CT HEAD WITHOUT CONTRAST CT CERVICAL SPINE WITHOUT CONTRAST TECHNIQUE: Multidetector CT imaging of the head and cervical spine was performed following the standard protocol without intravenous contrast. Multiplanar CT image  reconstructions of the cervical spine were also generated. COMPARISON:  None. FINDINGS: CT HEAD FINDINGS Brain: There is no intracranial hemorrhage, mass or evidence of acute infarction. There is remote lacunar infarction in the left centrum semiovale, left caudate, left putaminal and right insula. There is moderate generalized atrophy. There is moderate chronic microvascular ischemic change. There is no significant extra-axial fluid collection. No acute intracranial findings are evident. Vascular: No hyperdense vessel or unexpected calcification. Skull: Normal. Negative for fracture or focal lesion. Sinuses/Orbits: No acute finding. Other: None. CT CERVICAL SPINE FINDINGS Alignment: Normal. Skull base and vertebrae: No acute fracture. No primary bone lesion or focal pathologic process. Soft tissues and spinal canal: No prevertebral fluid or swelling. No visible canal hematoma. Disc levels: Good preservation of intervertebral disc spaces with mild cervical degenerative disc disease at C5-6 and C6-7. Facet articulations are mildly arthritic, otherwise intact. Upper chest: Negative Other: Atherosclerotic calcification of the left vertebral artery. IMPRESSION: 1. No acute intracranial findings. There is moderate generalized atrophy and chronic appearing white matter hypodensities which likely represent small vessel ischemic disease. Multiple remote lacunar infarctions. 2. Negative for acute cervical spine fracture Electronically Signed   By: Ellery Plunk M.D.   On: 08/18/2016 22:36  Dg Chest Portable 1 View  Result Date: 08/18/2016 CLINICAL DATA:  Right hip fracture EXAM: PORTABLE CHEST 1 VIEW COMPARISON:  None. FINDINGS: The lungs are grossly clear. Mild left hemidiaphragm elevation. No large effusion. No pneumothorax. Normal mediastinal contours. Normal pulmonary vasculature. No displaced fractures. IMPRESSION: No acute cardiopulmonary findings. Electronically Signed   By: Ellery Plunk M.D.   On:  08/18/2016 23:04   Dg Hip Unilat W Or Wo Pelvis 2-3 Views Right  Result Date: 08/18/2016 CLINICAL DATA:  RIGHT hip pain after fall tonight. History of dementia. EXAM: DG HIP (WITH OR WITHOUT PELVIS) 2-3V RIGHT COMPARISON:  None. FINDINGS: Acute comminuted RIGHT intertrochanteric fracture with impaction, slight varus angulation distal bony fragments. No dislocation. Status post LEFT hip hemiarthroplasty. Osteopenia without destructive bony lesions. Stool distended rectum. Moderate vascular calcifications. IMPRESSION: Acute displaced RIGHT femur intertrochanteric fracture. Electronically Signed   By: Awilda Metro M.D.   On: 08/18/2016 22:13    ____________________________________________   PROCEDURES  Procedure(s) performed:   Procedures  None ____________________________________________   INITIAL IMPRESSION / ASSESSMENT AND PLAN / ED COURSE  Pertinent labs & imaging results that were available during my care of the patient were reviewed by me and considered in my medical decision making (see chart for details).  Patient resents to the emergency department for evaluation after mechanical fall. She has exquisite tenderness with any movement of the right hip. The right leg is shortened and externally rotated. Clinically suspect right hip fracture. We'll also obtain CT scan of the head and cervical spine. The patient has underlying dementia which limits my history significantly. No family at bedside.   Spoke with family by phone, emergent contact Hailey Taylor, who reports that the patient is typically ambulatory. She does have a history of left hip replacement done several years ago in [place].   10:45 PM Spoke with Ortho Dr. Luiz Blare. Would like the patient transferred to [facility]. No OR times available at [facility] tomorrow so will try to fit in tomorrow at [facility] but no guarantee.   Discussed patient's case with hospitalist, Dr. Clyde Lundborg. Patient and family (if present) updated with plan. Care transferred  to hospitalist service.  I reviewed all nursing notes, vitals, pertinent old records, EKGs, labs, imaging (as available).  ____________________________________________  FINAL CLINICAL IMPRESSION(S) / ED DIAGNOSES  Final diagnoses:  Closed fracture of right hip, initial encounter (HCC)     MEDICATIONS GIVEN DURING THIS VISIT:  Medications  mupirocin ointment (BACTROBAN) 2 % 1 application (not administered)  hydrochlorothiazide (HYDRODIURIL) tablet 25 mg (not administered)  levothyroxine (SYNTHROID, LEVOTHROID) tablet 50 mcg (not administered)  rivastigmine (EXELON) 9.5 mg/24hr 9.5 mg (not administered)  Vitamin D3 CAPS 5,000 Units (not administered)  aspirin tablet 81 mg (not administered)  0.9 %  sodium chloride infusion (not administered)  ondansetron (ZOFRAN) injection 4 mg (not administered)  morphine 4 MG/ML injection 1 mg (not administered)  methocarbamol (ROBAXIN) tablet 500 mg (not administered)  oxyCODONE-acetaminophen (PERCOCET/ROXICET) 5-325 MG per tablet 1 tablet (not administered)  polyethylene glycol (MIRALAX / GLYCOLAX) packet 17 g (not administered)  zolpidem (AMBIEN) tablet 5 mg (not administered)  hydrALAZINE (APRESOLINE) injection 5 mg (not administered)  potassium chloride 20 MEQ/15ML (10%) solution 20 mEq (not administered)  fentaNYL (SUBLIMAZE) injection 50 mcg (50 mcg Intravenous Given 08/18/16 2136)  sodium chloride 0.9 % bolus 500 mL (0 mLs Intravenous Stopped 08/18/16 2228)     NEW OUTPATIENT MEDICATIONS STARTED DURING THIS VISIT:  None   Note:  This document was prepared using Dragon  voice recognition software and may include unintentional dictation errors.  Alona BeneJoshua Long, MD Emergency Medicine   Maia PlanJoshua G Long, MD 08/19/16 [id number]

## 2016-08-19 ENCOUNTER — Inpatient Hospital Stay (HOSPITAL_COMMUNITY)

## 2016-08-19 ENCOUNTER — Encounter (HOSPITAL_COMMUNITY)
Admission: EM | Disposition: A | Discharge status: Skilled Nursing Facility | Payer: Self-pay | Source: Home / Self Care | Attending: Internal Medicine

## 2016-08-19 ENCOUNTER — Inpatient Hospital Stay (HOSPITAL_COMMUNITY): Admitting: Certified Registered Nurse Anesthetist

## 2016-08-19 ENCOUNTER — Telehealth: Payer: Self-pay | Admitting: Family Medicine

## 2016-08-19 DIAGNOSIS — D72829 Elevated white blood cell count, unspecified: Secondary | ICD-10-CM

## 2016-08-19 DIAGNOSIS — E43 Unspecified severe protein-calorie malnutrition: Secondary | ICD-10-CM | POA: Insufficient documentation

## 2016-08-19 HISTORY — PX: FEMUR IM NAIL: SHX1597

## 2016-08-19 LAB — BASIC METABOLIC PANEL
ANION GAP: 7 (ref 5–15)
BUN: 29 mg/dL — ABNORMAL HIGH (ref 6–20)
CALCIUM: 8.4 mg/dL — AB (ref 8.9–10.3)
CO2: 24 mmol/L (ref 22–32)
Chloride: 106 mmol/L (ref 101–111)
Creatinine, Ser: 0.88 mg/dL (ref 0.44–1.00)
GFR calc Af Amer: >60 mL/min (ref >60)
GFR, EST NON AFRICAN AMERICAN: 55 mL/min — AB (ref >60)
Glucose, Bld: 112 mg/dL — ABNORMAL HIGH (ref 65–99)
POTASSIUM: 4.1 mmol/L (ref 3.5–5.1)
SODIUM: 137 mmol/L (ref 135–145)

## 2016-08-19 LAB — CBC
HEMATOCRIT: 22.9 % — AB (ref 36.0–46.0)
HEMOGLOBIN: 7.6 g/dL — AB (ref 12.0–15.0)
MCH: 29.9 pg (ref 26.0–34.0)
MCHC: 33.2 g/dL (ref 30.0–36.0)
MCV: 90.2 fL (ref 78.0–100.0)
Platelets: 214 10*3/uL (ref 150–400)
RBC: 2.54 MIL/uL — ABNORMAL LOW (ref 3.87–5.11)
RDW: 14.2 % (ref 11.5–15.5)
WBC: 9.5 10*3/uL (ref 4.0–10.5)

## 2016-08-19 LAB — ABO/RH
ABO/RH(D): A POS
ABO/RH(D): A POS

## 2016-08-19 LAB — MRSA PCR SCREENING: MRSA BY PCR: NEGATIVE

## 2016-08-19 LAB — PREPARE RBC (CROSSMATCH)

## 2016-08-19 SURGERY — INSERTION, INTRAMEDULLARY ROD, FEMUR
Anesthesia: Spinal | Laterality: Right

## 2016-08-19 MED ORDER — POVIDONE-IODINE 10 % EX SWAB: 2.0000 "application " | Freq: Once | CUTANEOUS | Status: DC

## 2016-08-19 MED ORDER — ALUM & MAG HYDROXIDE-SIMETH 200-200-20 MG/5ML PO SUSP: 30.0000 mL | ORAL | Status: DC | PRN

## 2016-08-19 MED ORDER — PHENYLEPHRINE HCL 10 MG/ML IJ SOLN
INTRAMUSCULAR | Status: DC | PRN
  Administered 2016-08-19: 80 ug via INTRAVENOUS

## 2016-08-19 MED ORDER — BUPIVACAINE IN DEXTROSE 0.75-8.25 % IT SOLN
INTRATHECAL | Status: DC | PRN
  Administered 2016-08-19: 12 mg via INTRATHECAL

## 2016-08-19 MED ORDER — PROMETHAZINE HCL 25 MG/ML IJ SOLN: 6.2500 mg | INTRAMUSCULAR | Status: DC | PRN

## 2016-08-19 MED ORDER — PHENYLEPHRINE HCL 10 MG/ML IJ SOLN
INTRAVENOUS | Status: DC | PRN
  Administered 2016-08-19: 50 ug/min via INTRAVENOUS

## 2016-08-19 MED ORDER — 0.9 % SODIUM CHLORIDE (POUR BTL) OPTIME
TOPICAL | Status: DC | PRN
  Administered 2016-08-19: 1000 mL

## 2016-08-19 MED ORDER — SODIUM CHLORIDE 0.9 % IV SOLN
INTRAVENOUS | Status: DC | PRN
  Administered 2016-08-19: 16:00:00 via INTRAVENOUS

## 2016-08-19 MED ORDER — HYDROCODONE-ACETAMINOPHEN 5-325 MG PO TABS
1.0000 | ORAL_TABLET | Freq: Four times a day (QID) | ORAL | Status: DC | PRN
  Administered 2016-08-22: 1 via ORAL
  Filled 2016-08-19: qty 1

## 2016-08-19 MED ORDER — ONDANSETRON HCL 4 MG PO TABS: 4.0000 mg | ORAL_TABLET | Freq: Four times a day (QID) | ORAL | Status: DC | PRN

## 2016-08-19 MED ORDER — CEFAZOLIN SODIUM-DEXTROSE 2-4 GM/100ML-% IV SOLN
2.0000 g | INTRAVENOUS | Status: AC
  Administered 2016-08-19: 2 g via INTRAVENOUS
  Filled 2016-08-19 (×2): qty 100

## 2016-08-19 MED ORDER — ONDANSETRON HCL 4 MG/2ML IJ SOLN: 4.0000 mg | Freq: Four times a day (QID) | INTRAMUSCULAR | Status: DC | PRN

## 2016-08-19 MED ORDER — CHLORHEXIDINE GLUCONATE 4 % EX LIQD: 60.0000 mL | Freq: Once | CUTANEOUS | Status: DC

## 2016-08-19 MED ORDER — HYDROMORPHONE HCL 2 MG/ML IJ SOLN: 0.2500 mg | INTRAMUSCULAR | Status: DC | PRN

## 2016-08-19 MED ORDER — SODIUM CHLORIDE 0.9 % IV SOLN
INTRAVENOUS | Status: DC
  Administered 2016-08-20: 06:00:00 via INTRAVENOUS

## 2016-08-19 MED ORDER — ASPIRIN EC 325 MG PO TBEC: 325.0000 mg | DELAYED_RELEASE_TABLET | Freq: Every day | ORAL | 0 refills | Status: DC

## 2016-08-19 MED ORDER — LACTATED RINGERS IV SOLN
INTRAVENOUS | Status: DC | PRN
Start: 2016-08-19 — End: 2016-08-19
  Administered 2016-08-19: 16:00:00 via INTRAVENOUS

## 2016-08-19 MED ORDER — ASPIRIN EC 325 MG PO TBEC
325.0000 mg | DELAYED_RELEASE_TABLET | Freq: Every day | ORAL | Status: DC
  Administered 2016-08-20 – 2016-08-22 (×3): 325 mg via ORAL
  Filled 2016-08-19 (×3): qty 1

## 2016-08-19 MED ORDER — FERROUS SULFATE 325 (65 FE) MG PO TABS
325.0000 mg | ORAL_TABLET | Freq: Two times a day (BID) | ORAL | Status: DC
  Administered 2016-08-20 – 2016-08-22 (×5): 325 mg via ORAL
  Filled 2016-08-19 (×5): qty 1

## 2016-08-19 MED ORDER — PROPOFOL 10 MG/ML IV BOLUS
INTRAVENOUS | Status: DC | PRN
  Administered 2016-08-19 (×3): 10 mg via INTRAVENOUS
  Administered 2016-08-19: 20 mg via INTRAVENOUS
  Administered 2016-08-19 (×2): 10 mg via INTRAVENOUS

## 2016-08-19 MED ORDER — ACETAMINOPHEN 325 MG PO TABS: 650.0000 mg | ORAL_TABLET | Freq: Four times a day (QID) | ORAL | Status: DC | PRN

## 2016-08-19 MED ORDER — FENTANYL CITRATE (PF) 100 MCG/2ML IJ SOLN: 25.0000 ug | INTRAMUSCULAR | Status: DC | PRN

## 2016-08-19 MED ORDER — ONDANSETRON HCL 4 MG/2ML IJ SOLN: 4.0000 mg | Freq: Once | INTRAMUSCULAR | Status: DC | PRN

## 2016-08-19 MED ORDER — CEFAZOLIN SODIUM-DEXTROSE 2-4 GM/100ML-% IV SOLN
2.0000 g | Freq: Two times a day (BID) | INTRAVENOUS | Status: AC
  Administered 2016-08-20 (×2): 2 g via INTRAVENOUS
  Filled 2016-08-19 (×2): qty 100

## 2016-08-19 MED ORDER — ACETAMINOPHEN 650 MG RE SUPP: 650.0000 mg | Freq: Four times a day (QID) | RECTAL | Status: DC | PRN

## 2016-08-19 MED ORDER — HYDROCODONE-ACETAMINOPHEN 5-325 MG PO TABS: 1.0000 | ORAL_TABLET | Freq: Three times a day (TID) | ORAL | 0 refills | Status: DC | PRN

## 2016-08-19 SURGICAL SUPPLY — 44 items
BIT DRILL 4.3MMS DISTAL GRDTED (BIT) IMPLANT
BLADE CLIPPER SURG (BLADE) ×1 IMPLANT
CORTICAL BONE SCR 5.0MM X 46MM (Screw) ×3 IMPLANT
COVER MAYO STAND STRL (DRAPES) ×2 IMPLANT
COVER PERINEAL POST (MISCELLANEOUS) ×3 IMPLANT
COVER SURGICAL LIGHT HANDLE (MISCELLANEOUS) ×6 IMPLANT
DECANTER SPIKE VIAL GLASS SM (MISCELLANEOUS) ×3 IMPLANT
DRAPE STERI IOBAN 125X83 (DRAPES) ×3 IMPLANT
DRILL 4.3MMS DISTAL GRADUATED (BIT) ×3
DRSG MEPILEX BORDER 4X4 (GAUZE/BANDAGES/DRESSINGS) ×7 IMPLANT
DRSG MEPILEX BORDER 4X8 (GAUZE/BANDAGES/DRESSINGS) ×3 IMPLANT
DURAPREP 26ML APPLICATOR (WOUND CARE) ×3 IMPLANT
ELECT CAUTERY BLADE 6.4 (BLADE) ×3 IMPLANT
ELECT REM PT RETURN 9FT ADLT (ELECTROSURGICAL) ×3
ELECTRODE REM PT RTRN 9FT ADLT (ELECTROSURGICAL) ×1 IMPLANT
EVACUATOR 1/8 PVC DRAIN (DRAIN) IMPLANT
GAUZE XEROFORM 5X9 LF (GAUZE/BANDAGES/DRESSINGS) ×3 IMPLANT
GLOVE BIOGEL PI IND STRL 8 (GLOVE) ×2 IMPLANT
GLOVE BIOGEL PI INDICATOR 8 (GLOVE) ×4
GLOVE ECLIPSE 7.5 STRL STRAW (GLOVE) ×6 IMPLANT
GOWN STRL REUS W/ TWL LRG LVL3 (GOWN DISPOSABLE) ×1 IMPLANT
GOWN STRL REUS W/ TWL XL LVL3 (GOWN DISPOSABLE) ×2 IMPLANT
GOWN STRL REUS W/TWL LRG LVL3 (GOWN DISPOSABLE) ×3
GOWN STRL REUS W/TWL XL LVL3 (GOWN DISPOSABLE) ×6
GUIDEWIRE BALL NOSE 80CM (WIRE) ×2 IMPLANT
HIP FRAC NAIL LAG SCR 10.5X100 (Orthopedic Implant) ×2 IMPLANT
KIT BASIN OR (CUSTOM PROCEDURE TRAY) ×3 IMPLANT
KIT ROOM TURNOVER OR (KITS) ×3 IMPLANT
LINER BOOT UNIVERSAL DISP (MISCELLANEOUS) ×1 IMPLANT
MANIFOLD NEPTUNE II (INSTRUMENTS) ×1 IMPLANT
NAIL HIP FRACTURE 11X380MM (Nail) ×2 IMPLANT
NS IRRIG 1000ML POUR BTL (IV SOLUTION) ×3 IMPLANT
PACK GENERAL/GYN (CUSTOM PROCEDURE TRAY) ×3 IMPLANT
PAD ARMBOARD 7.5X6 YLW CONV (MISCELLANEOUS) ×6 IMPLANT
PIN GUIDE 3.2 903003004 (MISCELLANEOUS) ×2 IMPLANT
SCREW CANN THRD AFF 10.5X100 (Orthopedic Implant) IMPLANT
SCREW CORTICL BON 5.0MM X 46MM (Screw) IMPLANT
STAPLER VISISTAT 35W (STAPLE) ×3 IMPLANT
SUT VIC AB 0 CTB1 27 (SUTURE) ×6 IMPLANT
SUT VIC AB 1 CTB1 27 (SUTURE) ×3 IMPLANT
SUT VIC AB 2-0 CTB1 (SUTURE) ×3 IMPLANT
TOWEL OR 17X24 6PK STRL BLUE (TOWEL DISPOSABLE) ×3 IMPLANT
TOWEL OR 17X26 10 PK STRL BLUE (TOWEL DISPOSABLE) ×3 IMPLANT
WATER STERILE IRR 1000ML POUR (IV SOLUTION) ×1 IMPLANT

## 2016-08-19 NOTE — Progress Notes (Signed)
Initial Nutrition Assessment  DOCUMENTATION CODES:   Severe malnutrition in context of chronic illness, Underweight  INTERVENTION:    Once diet advanced, add Ensure Enlive po BID, each supplement provides 350 kcal and 20 grams of protein  NUTRITION DIAGNOSIS:   Malnutrition related to chronic illness as evidenced by severe depletion of body fat, severe depletion of muscle mass  GOAL:   Patient will meet greater than or equal to 90% of their needs  MONITOR:   Diet advancement, PO intake, Supplement acceptance, Labs, Weight trends, I & O's  REASON FOR ASSESSMENT:   Consult Hip fracture protocol  ASSESSMENT:   83 y.o. Female with medical history significant of dementia, hypertension, hypothyroidism, chronic kidney disease-stage III, under hospice care currently, who presents with right hip pain after fall.  RD spoke with pt's daughter at bedside; pt confused. Daughter reports pt was eating well PTA. Drinks vanilla Ensure supplements at home. No chewing or swallowing difficulty reported. Plan is for surgery today.  Nutrition-Focused physical exam completed. Findings are severe fat depletion, severe muscle depletion, and no edema.   Diet Order:  Diet NPO time specified Except for: Ice Chips, Sips with Meds  Skin:  Reviewed, no issues  Last BM:  N/A  Height:   Ht Readings from Last 1 Encounters:  08/19/16 5\' 5"  (1.651 m)    Weight:   Wt Readings from Last 1 Encounters:  08/19/16 102 lb (46.3 kg)    Ideal Body Weight:  56.8 kg  BMI:  Body mass index is 16.97 kg/m.  Estimated Nutritional Needs:   Kcal:  1200-1400  Protein:  60-70 gm  Fluid:  >/= 1.5 L  EDUCATION NEEDS:   No education needs identified at this time  Maureen ChattersKatie Justiss Gerbino, RD, LDN Pager #: [phone] After-Hours Pager #: [phone]

## 2016-08-19 NOTE — Progress Notes (Signed)
Hailey Taylor                                                                              Patient Demographics  Hailey Taylor, is a 83 y.o. female, DOB - 02/06/1922, ZOX:[id number]  Admit date - 08/18/2016   Admitting Physician Hailey Harp, MD  Outpatient Primary MD for the patient is No primary care provider on file.  Outpatient specialists:   LOS - 1  days    Chief Complaint  Patient presents with  . Fall       Brief summary   83 year old female with dementia, hypertension, hypothyroidism, chronic kidney disease stage III, under hospice presented with a right hip fracture after mechanical fall.  X-ray of right hip showed acute displaced RIGHT femur intertrochanteric fracture. Ortho, Dr. Luiz Taylor was consulted.   Assessment & Plan   Principal problem Intertrochanteric Closed fracture of right hip Hailey Taylor):  - Orthopedics consulted, awaiting planned for surgery - Continue pain control, DVT prophylaxis per orthopedics - Continue gentle hydration while NPO   Active problems Fall: seems to be mechanical fall. -PTOT after the surgery  HTN: -continue HCTZ -prn IV hydralazine  Hypothyroidism: Last TSH was 4.180 -Continue home Synthroid  Late onset Alzheimer's disease without behavioral disturbance: -continue Exlon  Hypokalemia: K= 3.4  on admission. - Follow BMET  Normocytic anemia:  -Follow anemia panel  Leukocytosis: no signs of infection.  - Likely due to stress induced to demargination. - Obtain UA and culture, chest x-ray negative - Repeat CBC  Code Status:  DNR  DVT Prophylaxis:   SCD's Family Communication: No family member at the bedside. Discussed in detail with the patient, all imaging results, lab results explained to the patient    Disposition Plan: Awaiting surgery  Time Spent in minutes   25 minutes  Procedures:    Consultants:   Orthopedics  Antimicrobials:      Medications  Scheduled Meds: . aspirin  81 mg Oral  Daily  .  ceFAZolin (ANCEF) IV  2 g Intravenous To SS-Surg  . chlorhexidine  60 mL Topical Once  . cholecalciferol  5,000 Units Oral Daily  . hydrochlorothiazide  25 mg Oral Daily  . levothyroxine  50 mcg Oral QAC breakfast  . mupirocin ointment  1 application Topical QID  . povidone-iodine  2 application Topical Once  . rivastigmine  9.5 mg Transdermal Daily   Continuous Infusions: . sodium chloride 75 mL/hr at 08/19/16 0020   PRN Meds:.hydrALAZINE, methocarbamol, morphine injection, ondansetron, oxyCODONE-acetaminophen, polyethylene glycol, zolpidem   Antibiotics   Anti-infectives    Start     Dose/Rate Route Frequency Ordered Stop   08/19/16 1600  ceFAZolin (ANCEF) IVPB 2g/100 mL premix     2 g 200 mL/hr over 30 Minutes Intravenous To ShortStay Surgical 08/19/16 0419 08/20/16 1600        Subjective:   Hailey Taylor was seen and examined today.  Patient has dementia, very difficult to obtain review of systems from the patient, states she does not have any pain at this time. No fevers or chills. Currently no acute issues.  Objective:   Vitals:   08/19/16 0100 08/19/16  0200 08/19/16 0300 08/19/16 0356  BP: 105/59 109/55 134/67 (!) 125/93  Pulse: 66  80 84  Resp: 15 14 14 16   Temp:    98.5 F (36.9 C)  TempSrc:    Oral  SpO2: 95%  98% 100%    Intake/Output Summary (Last 24 hours) at 08/19/16 1127 Last data filed at 08/19/16 0953  Gross per 24 hour  Intake           388.75 ml  Output              800 ml  Net          -411.25 ml     Wt Readings from Last 3 Encounters:  05/21/16 46.5 kg (102 lb 9.6 oz)  01/09/16 48.4 kg (106 lb 9.6 oz)  11/07/15 45.6 kg (100 lb 8 oz)     Exam  General: Alert and Awake, somewhat confused, has dementia  HEENT:    Neck: Supple, no JVD  Cardiovascular: S1 S2 auscultated, no rubs, murmurs or gallops. Regular rate and rhythm.  Respiratory: Clear to auscultation bilaterally, no wheezing, rales or rhonchi  Gastrointestinal:  Soft, nontender, nondistended, + bowel sounds  Ext: no cyanosis clubbing or edema  Neuro:   Skin: No rashes  Psych: confused   Data Reviewed:  I have personally reviewed following labs and imaging studies  Micro Results Recent Results (from the past 240 hour(s))  MRSA PCR Screening     Status: None   Collection Time: 08/19/16  4:38 AM  Result Value Ref Range Status   MRSA by PCR NEGATIVE NEGATIVE Final    Comment:        The GeneXpert MRSA Assay (FDA approved for NASAL specimens only), is one component of a comprehensive MRSA colonization surveillance program. It is not intended to diagnose MRSA infection nor to guide or monitor treatment for MRSA infections.     Radiology Reports Ct Head Wo Contrast  Result Date: 08/18/2016 CLINICAL DATA:  [name]. EXAM: CT HEAD WITHOUT CONTRAST CT CERVICAL SPINE WITHOUT CONTRAST TECHNIQUE: Multidetector CT imaging of the head and cervical spine was performed following the standard protocol without intravenous contrast. Multiplanar CT image reconstructions of the cervical spine were also generated. COMPARISON:  None. FINDINGS: CT HEAD FINDINGS Brain: There is no intracranial hemorrhage, mass or evidence of acute infarction. There is remote lacunar infarction in the left centrum semiovale, left caudate, left putaminal and right insula. There is moderate generalized atrophy. There is moderate chronic microvascular ischemic change. There is no significant extra-axial fluid collection. No acute intracranial findings are evident. Vascular: No hyperdense vessel or unexpected calcification. Skull: Normal. Negative for fracture or focal lesion. Sinuses/Orbits: No acute finding. Other: None. CT CERVICAL SPINE FINDINGS Alignment: Normal. Skull base and vertebrae: No acute fracture. No primary bone lesion or focal pathologic process. Soft tissues and spinal canal: No prevertebral fluid or swelling. No  visible canal hematoma. Disc levels: Good preservation of intervertebral disc spaces with mild cervical degenerative disc disease at C5-6 and C6-7. Facet articulations are mildly arthritic, otherwise intact. Upper chest: Negative Other: Atherosclerotic calcification of the left vertebral artery. IMPRESSION: 1. No acute intracranial findings. There is moderate generalized atrophy and chronic appearing white matter hypodensities which likely represent small vessel ischemic disease. Multiple remote lacunar infarctions. 2. Negative for acute cervical spine fracture Electronically Signed   By: Ellery Plunkaniel R Mitchell M.D.   On: 08/18/2016 22:36   Ct  Cervical Spine Wo Contrast  Result Date: 08/18/2016 CLINICAL DATA:  Larey Seat at home when she missed the chair she was trying to sit on. EXAM: CT HEAD WITHOUT CONTRAST CT CERVICAL SPINE WITHOUT CONTRAST TECHNIQUE: Multidetector CT imaging of the head and cervical spine was performed following the standard protocol without intravenous contrast. Multiplanar CT image reconstructions of the cervical spine were also generated. COMPARISON:  None. FINDINGS: CT HEAD FINDINGS Brain: There is no intracranial hemorrhage, mass or evidence of acute infarction. There is remote lacunar infarction in the left centrum semiovale, left caudate, left putaminal and right insula. There is moderate generalized atrophy. There is moderate chronic microvascular ischemic change. There is no significant extra-axial fluid collection. No acute intracranial findings are evident. Vascular: No hyperdense vessel or unexpected calcification. Skull: Normal. Negative for fracture or focal lesion. Sinuses/Orbits: No acute finding. Other: None. CT CERVICAL SPINE FINDINGS Alignment: Normal. Skull base and vertebrae: No acute fracture. No primary bone lesion or focal pathologic process. Soft tissues and spinal canal: No prevertebral fluid or swelling. No visible canal hematoma. Disc levels: Good preservation of  intervertebral disc spaces with mild cervical degenerative disc disease at C5-6 and C6-7. Facet articulations are mildly arthritic, otherwise intact. Upper chest: Negative Other: Atherosclerotic calcification of the left vertebral artery. IMPRESSION: 1. No acute intracranial findings. There is moderate generalized atrophy and chronic appearing white matter hypodensities which likely represent small vessel ischemic disease. Multiple remote lacunar infarctions. 2. Negative for acute cervical spine fracture Electronically Signed   By: Ellery Plunk M.D.   On: 08/18/2016 22:36   Dg Chest Portable 1 View  Result Date: 08/18/2016 CLINICAL DATA:  Right hip fracture EXAM: PORTABLE CHEST 1 VIEW COMPARISON:  None. FINDINGS: The lungs are grossly clear. Mild left hemidiaphragm elevation. No large effusion. No pneumothorax. Normal mediastinal contours. Normal pulmonary vasculature. No displaced fractures. IMPRESSION: No acute cardiopulmonary findings. Electronically Signed   By: Ellery Plunk M.D.   On: 08/18/2016 23:04   Dg Hip Unilat W Or Wo Pelvis 2-3 Views Right  Result Date: 08/18/2016 CLINICAL DATA:  RIGHT hip pain after fall tonight. History of dementia. EXAM: DG HIP (WITH OR WITHOUT PELVIS) 2-3V RIGHT COMPARISON:  None. FINDINGS: Acute comminuted RIGHT intertrochanteric fracture with impaction, slight varus angulation distal bony fragments. No dislocation. Status post LEFT hip hemiarthroplasty. Osteopenia without destructive bony lesions. Stool distended rectum. Moderate vascular calcifications. IMPRESSION: Acute displaced RIGHT femur intertrochanteric fracture. Electronically Signed   By: Awilda Metro M.D.   On: 08/18/2016 22:13    Lab Data:  CBC:  Recent Labs Lab 08/18/16 2237  WBC 22.2*  NEUTROABS 19.6*  HGB 9.2*  HCT 28.1*  MCV 88.9  PLT 265   Basic Metabolic Panel:  Recent Labs Lab 08/18/16 2237  NA 137  K 3.4*  CL 104  CO2 25  GLUCOSE 134*  BUN 35*  CREATININE 1.00    CALCIUM 8.7*   GFR: CrCl cannot be calculated (Unknown ideal weight.). Liver Function Tests: No results for input(s): AST, ALT, ALKPHOS, BILITOT, PROT, ALBUMIN in the last 168 hours. No results for input(s): LIPASE, AMYLASE in the last 168 hours. No results for input(s): AMMONIA in the last 168 hours. Coagulation Profile:  Recent Labs Lab 08/18/16 2237  INR 1.18   Cardiac Enzymes: No results for input(s): CKTOTAL, CKMB, CKMBINDEX, TROPONINI in the last 168 hours. BNP (last 3 results) No results for input(s): PROBNP in the last 8760 hours. HbA1C: No results for input(s): HGBA1C in the last 72 hours. CBG:  No results for input(s): GLUCAP in the last 168 hours. Lipid Profile: No results for input(s): CHOL, HDL, LDLCALC, TRIG, CHOLHDL, LDLDIRECT in the last 72 hours. Thyroid Function Tests: No results for input(s): TSH, T4TOTAL, FREET4, T3FREE, THYROIDAB in the last 72 hours. Anemia Panel: No results for input(s): VITAMINB12, FOLATE, FERRITIN, TIBC, IRON, RETICCTPCT in the last 72 hours. Urine analysis:    Component Value Date/Time   BILIRUBINUR negative 01/09/2016 1545   KETONESUR negative 01/09/2016 1545   PROTEINUR negative 01/09/2016 1545   UROBILINOGEN 0.2 01/09/2016 1545   NITRITE Negative 01/09/2016 1545   LEUKOCYTESUR Trace (A) 01/09/2016 1545     Charmane Protzman M.D. Hailey Taylor 08/19/2016, 11:27 AM  Pager: [phone] Between 7am to 7pm - call Pager - [phone]  After 7pm go to [email or web address] - password [email or web address]  Call night coverage person covering after 7pm

## 2016-08-19 NOTE — Progress Notes (Addendum)
Hailey Taylor 6N20-01 - GIP visit - RN visit 1015am  This is a covered and related admission from 08/19/16 with a [facility] diagnosis of Alzheimer's (G30.9) by Dr. Barbee ShropshireHertweck.  Code status DNR.  Patient's family called [facility] on call after a fall last night.  On call contacted Dr. Barbee ShropshireHertweck who advised okay to call EMS and have patient taken to Emergency Department.  [facility]  Visited with patient this morning, patient was alert and pleasantly disoriented.  Patient in NAD.  Patient does not complain of any pain at this time.   Patient with broken R hip.  Patient laying on her left side, propped with pillows underneath her R side for stabilization.  Patient is oriented to person only.   Daughter is to come to the hospital this afternoon per Florentina AddisonKatie, [place].   Katie, RN further advises that patient is scheduled for hip surgery this afternoon around 4pm.   Patient will receive ceFAZolin (ANCEF) IVPB 2g/1900mL premix, Dose 2g via IV prior to surgery.  Patient currently has continuous infusion of 0.9% sodium chloride infusion, Rate 75 mL/hr via IV.  Patient received Morphine 4MG /ML injection 1 mg, Dose 1 mg via IV x 1 dose earlier today; ondansetron (ZOFRAN) injection 4 mg, Dose 4 mg via IV x 1 dose today.    Notified Dr. Barbee ShropshireHertweck and Dr. Clelia CroftShaw (left message with staff) of admission to hospital.  Transfer summary and med list placed on shadow chart.   Will continue to follow patient during hospital stay.   Thank you,  Adele BarthelAmy Evans, RN, BSN [facility] Hospital Liaison [phone]  All hospital liaison's are now on AMION.

## 2016-08-19 NOTE — Transfer of Care (Signed)
Immediate Anesthesia Transfer of Care Note  Patient: Hailey KernFaye D Taylor  Procedure(s) Performed: Procedure(s): INTRAMEDULLARY (IM) NAIL FEMORAL (Right)  Patient Location: PACU  Anesthesia Type:Spinal  Level of Consciousness: awake and confused  Airway & Oxygen Therapy: Patient Spontanous Breathing and Patient connected to nasal cannula oxygen  Post-op Assessment: Report given to RN and Post -op Vital signs reviewed and stable  Post vital signs: Reviewed and stable  Last Vitals:  Vitals:   08/19/16 1615 08/19/16 1629  BP:  (!) (P) 126/54  Pulse: (P) 68   Resp: (P) 16   Temp: (P) 36.7 C     Last Pain:  Vitals:   08/19/16 1545  TempSrc: Oral  PainSc:          Complications: No apparent anesthesia complications

## 2016-08-19 NOTE — Progress Notes (Addendum)
PT Cancellation Note  Patient Details Name: Hailey KernFaye D Taylor MRN: [id number] DOB: 09/14/1921   Cancelled Treatment:    Reason Eval/Treat Not Completed: Medical issues which prohibited therapy (Pt on bedrest and to have ORIF today.  Will check back in am) MD:  Please update orders after surgery with specific precautions and weight bearing status so that PT/OT can evaluate pt if MD desires.  Thanks.    Amadeo GarnetDawn F Jocelyn Nold 08/19/2016, 8:32 AM  [name],PT Acute Rehabilitation [phone] (pager)

## 2016-08-19 NOTE — Anesthesia Procedure Notes (Signed)
Spinal  Patient location during procedure: OR Start time: 08/19/2016 5:31 PM End time: 08/19/2016 5:41 PM Staffing Anesthesiologist: Heather RobertsSINGER, Emmaclaire Switala Performed: anesthesiologist  Preanesthetic Checklist Completed: patient identified, surgical consent, pre-op evaluation, timeout performed, IV checked, risks and benefits discussed and monitors and equipment checked Spinal Block Patient position: sitting Prep: DuraPrep Patient monitoring: cardiac monitor, continuous pulse ox and blood pressure Approach: midline Location: L2-3 Injection technique: single-shot Needle Needle type: Quincke  Needle gauge: 22 G Needle length: 9 cm Additional Notes Functioning IV was confirmed and monitors were applied. Sterile prep and drape, including hand hygiene and sterile gloves were used. The patient was positioned and the spine was prepped. The skin was anesthetized with lidocaine.  Free flow of clear CSF was obtained prior to injecting local anesthetic into the CSF.  The spinal needle aspirated freely following injection.  The needle was carefully withdrawn.  The patient tolerated the procedure well.

## 2016-08-19 NOTE — Brief Op Note (Signed)
08/18/2016 - 08/19/2016  6:44 PM  PATIENT:  Hailey Taylor  83 y.o. female  PRE-OPERATIVE DIAGNOSIS:  Right hip fracture  POST-OPERATIVE DIAGNOSIS:  Right hip fracture  PROCEDURE:  Procedure(s): INTRAMEDULLARY (IM) NAIL FEMORAL (Right)  SURGEON:  Surgeon(s) and Role:    * Jodi GeraldsJohn Navie Lamoreaux, MD - Primary  PHYSICIAN ASSISTANT:   ASSISTANTS: bethune   ANESTHESIA:   spinal  EBL:  Total I/O In: 310 [Blood:310] Out: 400 [Urine:250; Blood:150]  BLOOD ADMINISTERED:none  DRAINS: none   LOCAL MEDICATIONS USED:  MARCAINE     SPECIMEN:  No Specimen  DISPOSITION OF SPECIMEN:  N/A  COUNTS:  YES  TOURNIQUET:  * No tourniquets in log *  DICTATION: .Other Dictation: Dictation Number [id number]  PLAN OF CARE: Admit to inpatient   PATIENT DISPOSITION:  PACU - hemodynamically stable.   Delay start of Pharmacological VTE agent (>24hrs) due to surgical blood loss or risk of bleeding: no

## 2016-08-19 NOTE — Progress Notes (Signed)
OT Cancellation    08/19/16 1000  OT Visit Information  Last OT Received On 08/19/16  Reason Eval/Treat Not Completed Medical issues which prohibited therapy;Other (comment) (Pt placed on bed rest. Per RN, pt is scheduled for surgery today. Will check back tomorrow.)    Curlene DolphinCharis Juliannah Ohmann, OTR/L [phone]

## 2016-08-19 NOTE — Anesthesia Preprocedure Evaluation (Addendum)
Anesthesia Evaluation  Patient identified by MRN, date of birth, ID band Patient awake    Reviewed: Allergy & Precautions, NPO status , Patient's Chart, lab work & pertinent test results  Airway Mallampati: II  TM Distance: >3 FB Neck ROM: Full    Dental no notable dental hx.    Pulmonary neg pulmonary ROS, former smoker,    Pulmonary exam normal breath sounds clear to auscultation       Cardiovascular hypertension, negative cardio ROS Normal cardiovascular exam Rhythm:Regular Rate:Normal     Neuro/Psych PSYCHIATRIC DISORDERS Alzheimer'snegative neurological ROS  negative psych ROS   GI/Hepatic negative GI ROS, Neg liver ROS,   Endo/Other  negative endocrine ROSHypothyroidism   Renal/GU Renal InsufficiencyRenal diseasenegative Renal ROS  negative genitourinary   Musculoskeletal negative musculoskeletal ROS (+)   Abdominal   Peds negative pediatric ROS (+)  Hematology negative hematology ROS (+) anemia ,   Anesthesia Other Findings   Reproductive/Obstetrics negative OB ROS                             Anesthesia Physical Anesthesia Plan  ASA: IV  Anesthesia Plan: Spinal and MAC   Post-op Pain Management:    Induction: Intravenous  Airway Management Planned: Oral ETT  Additional Equipment:   Intra-op Plan:   Post-operative Plan: Extubation in OR  Informed Consent: I have reviewed the patients History and Physical, chart, labs and discussed the procedure including the risks, benefits and alternatives for the proposed anesthesia with the patient or authorized representative who has indicated his/her understanding and acceptance.   Dental advisory given  Plan Discussed with: CRNA and Surgeon  Anesthesia Plan Comments: (Anemic.  Will transfuse one unit and have a 2nd unit on call to the OR)       Anesthesia Quick Evaluation

## 2016-08-19 NOTE — Telephone Encounter (Signed)
i'm sorry to hear that. Please let me know if we can help in anyway. Of course will be happy to sign on all home health/home PT orders if she gets to go home after.

## 2016-08-19 NOTE — Care Management Note (Signed)
Case Management Note  Patient Details  Name: Hailey Taylor MRN: [id number] Date of Birth: 08/09/1921  Subjective/Objective:                    Action/Plan: From home with daughter , for  ORIF of hip fracture today, will follow for pot op PT/OT recommendations.  Expected Discharge Date:                  Expected Discharge Plan:     In-House Referral:     Discharge planning Services  CM Consult  Post Acute Care Choice:  Home Health Choice offered to:     DME Arranged:    DME Agency:     HH Arranged:    HH Agency:     Status of Service:  In process, will continue to follow  If discussed at Long Length of Stay Meetings, dates discussed:    Additional Comments:  [name], [name], RN 08/19/2016, 10:22 AM

## 2016-08-19 NOTE — Telephone Encounter (Signed)
[facility] called to let Clelia CroftShaw know that [facility] and that she will be going into surgery this afternoon.  [phone]

## 2016-08-19 NOTE — ED Notes (Signed)
Pt.'s daughter Charlott HollerJudy Strawn  Tel.no. [phone] and cell no. [phone] requested to be called anytime for pt.'s update or change of condition anytime. Pt. Son's  Etter SjogrenAustin Strawn [phone].

## 2016-08-19 NOTE — Progress Notes (Signed)
When prepping patient to go to OR, I noted the hemoglobin was 7.6. I called Dr. Luiz BlareGraves to notify him, and then passed on his message to start blood to the anesthesiologist who will be caring for patient during her  operation.

## 2016-08-20 ENCOUNTER — Encounter (HOSPITAL_COMMUNITY): Payer: Self-pay | Admitting: Orthopedic Surgery

## 2016-08-20 LAB — BASIC METABOLIC PANEL
ANION GAP: 8 (ref 5–15)
BUN: 24 mg/dL — AB (ref 6–20)
CHLORIDE: 106 mmol/L (ref 101–111)
CO2: 22 mmol/L (ref 22–32)
Calcium: 8.2 mg/dL — ABNORMAL LOW (ref 8.9–10.3)
Creatinine, Ser: 0.76 mg/dL (ref 0.44–1.00)
GFR calc Af Amer: >60 mL/min (ref >60)
GLUCOSE: 109 mg/dL — AB (ref 65–99)
Potassium: 3.6 mmol/L (ref 3.5–5.1)
Sodium: 136 mmol/L (ref 135–145)

## 2016-08-20 LAB — CBC
HEMATOCRIT: 21.7 % — AB (ref 36.0–46.0)
HEMOGLOBIN: 7.3 g/dL — AB (ref 12.0–15.0)
MCH: 28.9 pg (ref 26.0–34.0)
MCHC: 33.6 g/dL (ref 30.0–36.0)
MCV: 85.8 fL (ref 78.0–100.0)
Platelets: 153 10*3/uL (ref 150–400)
RBC: 2.53 MIL/uL — AB (ref 3.87–5.11)
RDW: 16.5 % — ABNORMAL HIGH (ref 11.5–15.5)
WBC: 7.1 10*3/uL (ref 4.0–10.5)

## 2016-08-20 LAB — PREPARE RBC (CROSSMATCH)

## 2016-08-20 LAB — HEMOGLOBIN AND HEMATOCRIT, BLOOD
HEMATOCRIT: 29.1 % — AB (ref 36.0–46.0)
HEMOGLOBIN: 9.8 g/dL — AB (ref 12.0–15.0)

## 2016-08-20 MED ORDER — ENSURE ENLIVE PO LIQD
237.0000 mL | Freq: Three times a day (TID) | ORAL | Status: DC
  Administered 2016-08-20 – 2016-08-22 (×4): 237 mL via ORAL

## 2016-08-20 MED ORDER — SODIUM CHLORIDE 0.9 % IV SOLN
Freq: Once | INTRAVENOUS | Status: AC
  Administered 2016-08-20: 11:00:00 via INTRAVENOUS

## 2016-08-20 NOTE — Progress Notes (Signed)
[facility] RN Visit @ 8:30 am  This is a covered and related admission from 08/19/16, with a [facility] diagnosis of Alzheimer's, per Dr. Barbee ShropshireHertweck.  Code status DNR. Patient was admitted for treatment for a right hip fracture, after sustaining a fall in her home.  Visited patient in room, with no family present.  Patient alert and pleasantly confused.  Patient appears comfortable and denies any pain.  Patient underwent surgery yesterday, where ORIF was completed to right hip.  PT to work with patient today.  Patient has untouched breakfast tray at bedside, but denies wanting to eat.  Patient's hemoglobin was 7.3 this morning.  She is currently receiving Ancef IV BID via a PIV.  She has receiving 2 doses of 1 mg Morphine IV and 500 mg Robaxin the past 24 hours, per chart review.   Hospital liaison spoke with hospice social worker, Alphonzo LemmingsWhitney, yesterday to update on patient's status.  Alphonzo LemmingsWhitney will be available to assist with discharge planning.  [facility] will continue to follow and anticipate any discharge needs.  Please call with any hospice-related questions or concerns.  Thank you,  Hessie KnowsStacie Wilkinson, RN, BSN [facility] Hospital Liaison [phone]  All hospital liaison's are now on AMION.

## 2016-08-20 NOTE — Progress Notes (Addendum)
Subjective: 1 Day Post-Op Procedure(s) (LRB): INTRAMEDULLARY (IM) NAIL FEMORAL (Right) Patient reports pain as difficult to tell but looke comfortable.    Objective: Vital signs in last 24 hours: Temp:  [97.3 F (36.3 C)-98.4 F (36.9 C)] 98.1 F (36.7 C) (03/08 0506) Pulse Rate:  [56-97] 78 (03/08 0506) Resp:  [12-19] 17 (03/08 0506) BP: (80-138)/(35-104) 134/56 (03/08 0506) SpO2:  [91 %-100 %] 94 % (03/08 0506) FiO2 (%):  [21 %] 21 % (03/07 2041) Weight:  [46.3 kg (102 lb)] 46.3 kg (102 lb) (03/07 1411)  Intake/Output from previous day: 03/07 0701 - 03/08 0700 In: 991.3 [P.O.:60; I.V.:621.3; Blood:310] Out: 1000 [Urine:850; Blood:150] Intake/Output this shift: No intake/output data recorded.   Recent Labs  08/18/16 2237 08/19/16 1133 08/20/16 0410  HGB 9.2* 7.6* 7.3*    Recent Labs  08/19/16 1133 08/20/16 0410  WBC 9.5 7.1  RBC 2.54* 2.53*  HCT 22.9* 21.7*  PLT 214 153    Recent Labs  08/19/16 1133 08/20/16 0410  NA 137 136  K 4.1 3.6  CL 106 106  CO2 24 22  BUN 29* 24*  CREATININE 0.88 0.76  GLUCOSE 112* 109*  CALCIUM 8.4* 8.2*    Recent Labs  08/18/16 2237  INR 1.18    Neurologically intact ABD soft Neurovascular intact No cellulitis present Compartment soft  Assessment/Plan: 1 Day Post-Op Procedure(s) (LRB): INTRAMEDULLARY (IM) NAIL FEMORAL (Right)  Acute blood loss anemia-- Pt has lost a significant amount of blood from her fracture needing transfusion pre op and had blood loss intra-op to necessitate a post op transfusion. Advance diet Up with therapy Discharge to SNF likely based on overall situation  Fiana Gladu L 08/20/2016, 7:55 AM

## 2016-08-20 NOTE — Evaluation (Signed)
Occupational Therapy Evaluation Patient Details Name: Hailey KernFaye D Connell MRN: [id number] DOB: 05/29/1922 Today's Date: 08/20/2016    History of Present Illness Presents with acute displaced RIGHT femur intertrochanteric fracture and recieved intramedullary nail femoral procedure (08/19/16). Medical history significant of dementia, hypertension, hypothyroidism, and chronic kidney disease-stage III.   Clinical Impression   PTA, pt was living with daughter. Pt was able to perform self feeding and grooming with set up and supervision and was receiving A for bathing and dressing from daughter and Hospice. Currently, pt requires Mod A + 2 for functional mobility and is Max A for LB ADLs. Pt would benefit from continued acute OT to increase pt occupational participation and independence. Recommend dc to SNF for further OT to increase independence in ADLs and functional mobility as well as reduce caregiver burden.    Follow Up Recommendations  SNF;Supervision/Assistance - 24 hour    Equipment Recommendations  Other (comment) (Defer to next venue)    Recommendations for Other Services       Precautions / Restrictions Precautions Precautions: Other (comment) (Partial WB (50%)) Restrictions Weight Bearing Restrictions: Yes RLE Weight Bearing: Partial weight bearing RLE Partial Weight Bearing Percentage or Pounds: 50%      Mobility Bed Mobility Overal bed mobility: Needs Assistance Bed Mobility: Supine to Sit;Sit to Supine     Supine to sit: Mod assist;+2 for physical assistance;HOB elevated Sit to supine: Max assist;+2 for physical assistance;HOB elevated      Transfers Overall transfer level: Needs assistance Equipment used: Rolling walker (2 wheeled) Transfers: Sit to/from Stand Sit to Stand: Mod assist;+2 physical assistance;From elevated surface              Balance Overall balance assessment: Needs assistance;History of Falls Sitting-balance support: Bilateral upper extremity  supported;Feet supported Sitting balance-Leahy Scale: Fair     Standing balance support: Bilateral upper extremity supported Standing balance-Leahy Scale: Poor                              ADL Overall ADL's : Needs assistance/impaired Eating/Feeding: Minimal assistance;Bed level Eating/Feeding Details (indicate cue type and reason): Daughter reports that pt hasnt been eating and she will help with self feeding and initition of task Grooming: Set up;Cueing for sequencing;Supervision/safety;Sitting   Upper Body Bathing: Moderate assistance;Sitting;With caregiver independent assisting   Lower Body Bathing: Maximal assistance;With caregiver independent assisting;Sit to/from stand Lower Body Bathing Details (indicate cue type and reason): Pt sit>stand with Mod A+2 for caregiver to wash back and bottom Upper Body Dressing : Moderate assistance;Sitting   Lower Body Dressing: Maximal assistance;With caregiver independent assisting;Sit to/from stand   Toilet Transfer: +2 for physical assistance;Stand-pivot;BSC;RW;Maximal assistance   Toileting- Clothing Manipulation and Hygiene: Maximal assistance;Sit to/from stand         General ADL Comments: Pt's partcipation in ADLs in limited at this time due to pain.      Vision         Perception     Praxis      Pertinent Vitals/Pain Pain Assessment: Faces Faces Pain Scale: Hurts even more     Hand Dominance Right   Extremity/Trunk Assessment Upper Extremity Assessment Upper Extremity Assessment: Overall WFL for tasks assessed   Lower Extremity Assessment Lower Extremity Assessment: Defer to PT evaluation       Communication Communication Communication: Expressive difficulties;Other (comment) (Decreased cognition)   Cognition Arousal/Alertness: Awake/alert Behavior During Therapy: Anxious Overall Cognitive Status: History of cognitive impairments - at  baseline                 General Comments: Pt  required verbal and tactile cues and benefits from encouragment/reassurance   General Comments  Pt IV was leaking prior to OT visit. Communicated with RN. RN requested to only sit<>stand to change bed pad and then return pt to bed to finish IV.    Exercises       Shoulder Instructions      Home Living Family/patient expects to be discharged to:: Private residence Living Arrangements: Children Available Help at Discharge: Skilled Nursing Facility                                    Prior Functioning/Environment Level of Independence: Needs assistance  Gait / Transfers Assistance Needed: walks with RW in house, cane in the community ADL's / Homemaking Assistance Needed: Hospice/daughter A with bathing and dressing; pt able to perform grooming and self feeding with supervision and set up            OT Problem List: Decreased activity tolerance;Impaired balance (sitting and/or standing);Decreased strength;Decreased cognition;Decreased safety awareness;Pain;Decreased knowledge of use of DME or AE      OT Treatment/Interventions: Self-care/ADL training;Therapeutic exercise;Energy conservation;Therapeutic activities;Patient/family education    OT Goals(Current goals can be found in the care plan section) Acute Rehab OT Goals Patient Stated Goal: walk again OT Goal Formulation: With patient/family Time For Goal Achievement: 09/03/16 Potential to Achieve Goals: Good ADL Goals Pt Will Perform Grooming: with min assist;standing Pt Will Transfer to Toilet: with min assist;bedside commode;ambulating Pt Will Perform Toileting - Clothing Manipulation and hygiene: with min assist;sit to/from stand  OT Frequency: Min 2X/week   Barriers to D/C:            Co-evaluation PT/OT/SLP Co-Evaluation/Treatment: Yes Reason for Co-Treatment: For patient/therapist safety PT goals addressed during session: Mobility/safety with mobility OT goals addressed during session: ADL's and  self-care      End of Session Equipment Utilized During Treatment: Gait belt;Rolling walker Nurse Communication: Mobility status;Other (comment) (Occulsion of IV)  Activity Tolerance: Patient limited by pain;Patient tolerated treatment well Patient left: in bed;with call bell/phone within reach;with bed alarm set;with family/visitor present  OT Visit Diagnosis: Unsteadiness on feet (R26.81);Muscle weakness (generalized) (M62.81);History of falling (Z91.81);Other symptoms and signs involving cognitive function;Pain Pain - Right/Left: Right Pain - part of body: Hip                ADL either performed or assessed with clinical judgement  Time: 1610-9604 OT Time Calculation (min): 53 min Charges:  OT General Charges $OT Visit: 1 Procedure OT Evaluation $OT Eval Moderate Complexity: 1 Procedure OT Treatments $Therapeutic Activity: 8-22 mins G-Codes:     [employer], OTR/L [phone]  Theodoro Grist Kerman Pfost 08/20/2016, 4:44 PM

## 2016-08-20 NOTE — Consult Note (Signed)
           [facility] Primary Care Navigator  08/20/2016  Randol KernFaye D Jhaveri 12/21/1921 [id number]   Went to see patient in the room to identify possible discharge needs, but she was asleep with no family present. Able to speak with patient's nurse. Electronic Medical Record reveals that patient was admitted for treatment of right hip fracture, after sustaining a fall in her home.  Patient had been under hospice care [facility] will continue to follow and anticipate any discharge needs per note.  For questions, please contact:  Wyatt HasteLorraine Dinesha Twiggs, BSN, RN- [facility] Primary [facility] Navigator  Telephone: [phone] [facility]

## 2016-08-20 NOTE — Progress Notes (Signed)
[facility]                                                                              Patient Demographics  Hailey Taylor, is a 83 y.o. female, DOB - [birth year], ZOX:[id number]  Admit date - 08/18/2016   Admitting Physician Lorretta Harp, MD  Outpatient Primary MD for the patient is No primary care provider on file.  Outpatient specialists:   LOS - 2  days    Chief Complaint  Patient presents with  . Fall       Brief summary   83 year old female with dementia, hypertension, hypothyroidism, chronic kidney disease stage III, under hospice presented with a right hip fracture after mechanical fall.  X-ray of right hip showed acute displaced RIGHT femur intertrochanteric fracture. Ortho, Dr. Luiz Blare was consulted.   Assessment & Plan   Principal problem Intertrochanteric Closed fracture of right hip [facility]):  - Orthopedics consulted, awaiting planned for surgery - Continue pain control, DVT prophylaxis per orthopedics - Postop day #1, status post intramedullary femoral nail   Active problems Acute on chronic Anemia - Likely secondary to surgery - Transfuse 2 units packed RBCs  Fall: seems to be mechanical fall. -PTOT after the surgery  HTN: -continue HCTZ -prn IV hydralazine  Hypothyroidism: Last TSH was 4.180 -Continue home Synthroid  Late onset Alzheimer's disease without behavioral disturbance: -continue Exlon  Leukocytosis: no signs of infection.  - Likely due to stress induced to demargination. - UA still pending, chest x-ray negative - WBCs normalized  Code Status:  DNR  DVT Prophylaxis:   On full dose aspirin Family Communication: No family member at the bedside.   Disposition Plan:  Time Spent in minutes   25 minutes  Procedures:    Consultants:   Orthopedics  Antimicrobials:      Medications  Scheduled Meds: . aspirin EC  325 mg Oral Q breakfast  .  ceFAZolin (ANCEF) IV  2 g Intravenous Q12H  . cholecalciferol  5,000  Units Oral Daily  . feeding supplement (ENSURE ENLIVE)  237 mL Oral TID BM  . ferrous sulfate  325 mg Oral BID WC  . hydrochlorothiazide  25 mg Oral Daily  . levothyroxine  50 mcg Oral QAC breakfast  . mupirocin ointment  1 application Topical QID  . rivastigmine  9.5 mg Transdermal Daily   Continuous Infusions: . sodium chloride 50 mL/hr at 08/20/16 0613   PRN Meds:.acetaminophen **OR** acetaminophen, alum & mag hydroxide-simeth, hydrALAZINE, HYDROcodone-acetaminophen, HYDROmorphone (DILAUDID) injection, methocarbamol, morphine injection, ondansetron **OR** ondansetron (ZOFRAN) IV, polyethylene glycol, promethazine, zolpidem   Antibiotics   Anti-infectives    Start     Dose/Rate Route Frequency Ordered Stop   08/20/16 0600  ceFAZolin (ANCEF) IVPB 2g/100 mL premix     2 g 200 mL/hr over 30 Minutes Intravenous Every 12 hours 08/19/16 2041 08/21/16 0559   08/19/16 1600  ceFAZolin (ANCEF) IVPB 2g/100 mL premix     2 g 200 mL/hr over 30 Minutes Intravenous To [facility] 08/19/16 0419 08/19/16 1741        Subjective:   Hailey Taylor was seen and examined today.  Patient has dementia,  very difficult to obtain review of systems from the patient. Confused. No fevers or chills.   Objective:   Vitals:   08/20/16 1037 08/20/16 1115 08/20/16 1142 08/20/16 1337  BP: (!) 92/39 (!) 95/42 (!) 94/39 (!) 118/59  Pulse: 85 87 84 87  Resp: 16 18 18 14   Temp: 97.8 F (36.6 C) 97.9 F (36.6 C) 97.9 F (36.6 C) 98.2 F (36.8 C)  TempSrc: Oral Oral Oral Oral  SpO2: 95% 96% 95% 93%  Weight:      Height:        Intake/Output Summary (Last 24 hours) at 08/20/16 1407 Last data filed at 08/20/16 1356  Gross per 24 hour  Intake           865.17 ml  Output              750 ml  Net           115.17 ml     Wt Readings from Last 3 Encounters:  08/19/16 46.3 kg (102 lb)  05/21/16 46.5 kg (102 lb 9.6 oz)  01/09/16 48.4 kg (106 lb 9.6 oz)     Exam  General: Alert and Awake,  confused, has dementia  HEENT:    Neck: Supple, no JVD  Cardiovascular: S1 S2 Clear, RRR  Respiratory: Clear to auscultation bilaterally, no wheezing, rales or rhonchi  Gastrointestinal: Soft, nontender, nondistended, + bowel sounds  Ext: no cyanosis clubbing or edema  Neuro:   Skin: No rashes  Psych: confused   Data Reviewed:  I have personally reviewed following labs and imaging studies  Micro Results Recent Results (from the past 240 hour(s))  MRSA PCR Screening     Status: None   Collection Time: 08/19/16  4:38 AM  Result Value Ref Range Status   MRSA by PCR NEGATIVE NEGATIVE Final    Comment:        The GeneXpert MRSA Assay (FDA approved for NASAL specimens only), is one component of a comprehensive MRSA colonization surveillance program. It is not intended to diagnose MRSA infection nor to guide or monitor treatment for MRSA infections.     Radiology Reports Ct Head Wo Contrast  Result Date: 08/18/2016 CLINICAL DATA:  Larey Seat at home when she missed the chair she was trying to sit on. EXAM: CT HEAD WITHOUT CONTRAST CT CERVICAL SPINE WITHOUT CONTRAST TECHNIQUE: Multidetector CT imaging of the head and cervical spine was performed following the standard protocol without intravenous contrast. Multiplanar CT image reconstructions of the cervical spine were also generated. COMPARISON:  None. FINDINGS: CT HEAD FINDINGS Brain: There is no intracranial hemorrhage, mass or evidence of acute infarction. There is remote lacunar infarction in the left centrum semiovale, left caudate, left putaminal and right insula. There is moderate generalized atrophy. There is moderate chronic microvascular ischemic change. There is no significant extra-axial fluid collection. No acute intracranial findings are evident. Vascular: No hyperdense vessel or unexpected calcification. Skull: Normal. Negative for fracture or focal lesion. Sinuses/Orbits: No acute finding. Other: None. CT CERVICAL SPINE  FINDINGS Alignment: Normal. Skull base and vertebrae: No acute fracture. No primary bone lesion or focal pathologic process. Soft tissues and spinal canal: No prevertebral fluid or swelling. No visible canal hematoma. Disc levels: Good preservation of intervertebral disc spaces with mild cervical degenerative disc disease at C5-6 and C6-7. Facet articulations are mildly arthritic, otherwise intact. Upper chest: Negative Other: Atherosclerotic calcification of the left vertebral artery. IMPRESSION: 1. No acute intracranial findings. There is moderate generalized atrophy and  chronic appearing white matter hypodensities which likely represent small vessel ischemic disease. Multiple remote lacunar infarctions. 2. Negative for acute cervical spine fracture Electronically Signed   By: Ellery Plunk M.D.   On: 08/18/2016 22:36   Ct Cervical Spine Wo Contrast  Result Date: 08/18/2016 CLINICAL DATA:  Larey Seat at home when she missed the chair she was trying to sit on. EXAM: CT HEAD WITHOUT CONTRAST CT CERVICAL SPINE WITHOUT CONTRAST TECHNIQUE: Multidetector CT imaging of the head and cervical spine was performed following the standard protocol without intravenous contrast. Multiplanar CT image reconstructions of the cervical spine were also generated. COMPARISON:  None. FINDINGS: CT HEAD FINDINGS Brain: There is no intracranial hemorrhage, mass or evidence of acute infarction. There is remote lacunar infarction in the left centrum semiovale, left caudate, left putaminal and right insula. There is moderate generalized atrophy. There is moderate chronic microvascular ischemic change. There is no significant extra-axial fluid collection. No acute intracranial findings are evident. Vascular: No hyperdense vessel or unexpected calcification. Skull: Normal. Negative for fracture or focal lesion. Sinuses/Orbits: No acute finding. Other: None. CT CERVICAL SPINE FINDINGS Alignment: Normal. Skull base and vertebrae: No acute  fracture. No primary bone lesion or focal pathologic process. Soft tissues and spinal canal: No prevertebral fluid or swelling. No visible canal hematoma. Disc levels: Good preservation of intervertebral disc spaces with mild cervical degenerative disc disease at C5-6 and C6-7. Facet articulations are mildly arthritic, otherwise intact. Upper chest: Negative Other: Atherosclerotic calcification of the left vertebral artery. IMPRESSION: 1. No acute intracranial findings. There is moderate generalized atrophy and chronic appearing white matter hypodensities which likely represent small vessel ischemic disease. Multiple remote lacunar infarctions. 2. Negative for acute cervical spine fracture Electronically Signed   By: Ellery Plunk M.D.   On: 08/18/2016 22:36   Dg Chest Portable 1 View  Result Date: 08/18/2016 CLINICAL DATA:  Right hip fracture EXAM: PORTABLE CHEST 1 VIEW COMPARISON:  None. FINDINGS: The lungs are grossly clear. Mild left hemidiaphragm elevation. No large effusion. No pneumothorax. Normal mediastinal contours. Normal pulmonary vasculature. No displaced fractures. IMPRESSION: No acute cardiopulmonary findings. Electronically Signed   By: Ellery Plunk M.D.   On: 08/18/2016 23:04   Dg C-arm 1-60 Min  Result Date: 08/19/2016 CLINICAL DATA:  83 y/o F; intramedullary nail placement of the right femur. EXAM: RIGHT FEMUR 2 VIEWS; DG C-ARM 61-120 MIN COMPARISON:  Right lower extremity radiographs dated 08/18/2016. FINDINGS: Intramedullary nail with proximal compression screw and distal interlocking threaded screw traversing intertrochanteric fracture. Fluoro time is 45 seconds. IMPRESSION: Intraoperative fluoroscopy for nail fixation of intertrochanteric right femur fracture. Electronically Signed   By: Mitzi Hansen M.D.   On: 08/19/2016 20:43   Dg Hip Unilat W Or Wo Pelvis 2-3 Views Right  Result Date: 08/18/2016 CLINICAL DATA:  RIGHT hip pain after fall tonight. History of  dementia. EXAM: DG HIP (WITH OR WITHOUT PELVIS) 2-3V RIGHT COMPARISON:  None. FINDINGS: Acute comminuted RIGHT intertrochanteric fracture with impaction, slight varus angulation distal bony fragments. No dislocation. Status post LEFT hip hemiarthroplasty. Osteopenia without destructive bony lesions. Stool distended rectum. Moderate vascular calcifications. IMPRESSION: Acute displaced RIGHT femur intertrochanteric fracture. Electronically Signed   By: Awilda Metro M.D.   On: 08/18/2016 22:13   Dg Femur, Min 2 Views Right  Result Date: 08/19/2016 CLINICAL DATA:  83 y/o F; intramedullary nail placement of the right femur. EXAM: RIGHT FEMUR 2 VIEWS; DG C-ARM 61-120 MIN COMPARISON:  Right lower extremity radiographs dated 08/18/2016. FINDINGS: Intramedullary nail  with proximal compression screw and distal interlocking threaded screw traversing intertrochanteric fracture. Fluoro time is 45 seconds. IMPRESSION: Intraoperative fluoroscopy for nail fixation of intertrochanteric right femur fracture. Electronically Signed   By: Mitzi HansenLance  Furusawa-Stratton M.D.   On: 08/19/2016 20:43    Lab Data:  CBC:  Recent Labs Lab 08/18/16 2237 08/19/16 1133 08/20/16 0410  WBC 22.2* 9.5 7.1  NEUTROABS 19.6*  --   --   HGB 9.2* 7.6* 7.3*  HCT 28.1* 22.9* 21.7*  MCV 88.9 90.2 85.8  PLT 265 214 153   Basic Metabolic Panel:  Recent Labs Lab 08/18/16 2237 08/19/16 1133 08/20/16 0410  NA 137 137 136  K 3.4* 4.1 3.6  CL 104 106 106  CO2 25 24 22   GLUCOSE 134* 112* 109*  BUN 35* 29* 24*  CREATININE 1.00 0.88 0.76  CALCIUM 8.7* 8.4* 8.2*   GFR: Estimated Creatinine Clearance: 32.8 mL/min (by C-G formula based on SCr of 0.76 mg/dL). Liver Function Tests: No results for input(s): AST, ALT, ALKPHOS, BILITOT, PROT, ALBUMIN in the last 168 hours. No results for input(s): LIPASE, AMYLASE in the last 168 hours. No results for input(s): AMMONIA in the last 168 hours. Coagulation Profile:  Recent Labs Lab  08/18/16 2237  INR 1.18   Cardiac Enzymes: No results for input(s): CKTOTAL, CKMB, CKMBINDEX, TROPONINI in the last 168 hours. BNP (last 3 results) No results for input(s): PROBNP in the last 8760 hours. HbA1C: No results for input(s): HGBA1C in the last 72 hours. CBG: No results for input(s): GLUCAP in the last 168 hours. Lipid Profile: No results for input(s): CHOL, HDL, LDLCALC, TRIG, CHOLHDL, LDLDIRECT in the last 72 hours. Thyroid Function Tests: No results for input(s): TSH, T4TOTAL, FREET4, T3FREE, THYROIDAB in the last 72 hours. Anemia Panel: No results for input(s): VITAMINB12, FOLATE, FERRITIN, TIBC, IRON, RETICCTPCT in the last 72 hours. Urine analysis:    Component Value Date/Time   BILIRUBINUR negative 01/09/2016 1545   KETONESUR negative 01/09/2016 1545   PROTEINUR negative 01/09/2016 1545   UROBILINOGEN 0.2 01/09/2016 1545   NITRITE Negative 01/09/2016 1545   LEUKOCYTESUR Trace (A) 01/09/2016 1545     Mirtie Bastyr M.D. [facility] 08/20/2016, 2:07 PM  Pager: [phone] Between 7am to 7pm - call Pager - [phone]  After 7pm go to [email or web address] - password [email or web address]  Call night coverage person covering after 7pm

## 2016-08-20 NOTE — Progress Notes (Signed)
1730 Blood transfusion completed, no transfusion reaction noted at this time.

## 2016-08-20 NOTE — Progress Notes (Signed)
PT Cancellation Note  Patient Details Name: Hailey Taylor MRN: [id number] DOB: 05/28/1922   Cancelled Treatment:    Reason Eval/Treat Not Completed: Medical issues which prohibited therapy (Awaiting clarification of orders regarding timing as order states to start today at 6:53 pm and there will not be staff here to evaluate at that time.  Also need to clarify weight bearing as order states TDWB and PWB 50% in same order set.)  Spoke with nurse who is contacting PA.  Note that pt is to receive blood today as well as Hgb 7.3.  Will await update from nurse or MD to proceed with evaluation today or tomorrow.  Thanks.   Hailey Taylor 08/20/2016, 10:15 AM Hailey Taylor,PT Acute Rehabilitation [phone])

## 2016-08-20 NOTE — Progress Notes (Signed)
Nutrition Follow-up  DOCUMENTATION CODES:   Severe malnutrition in context of chronic illness, Underweight  INTERVENTION:   -Ensure Enlive po TID, each supplement provides 350 kcal and 20 grams of protein  NUTRITION DIAGNOSIS:   Malnutrition related to chronic illness as evidenced by severe depletion of body fat, severe depletion of muscle mass.  Ongoing  GOAL:   Patient will meet greater than or equal to 90% of their needs  Unmet  MONITOR:   Diet advancement, PO intake, Supplement acceptance, Labs, Weight trends, I & O's  REASON FOR ASSESSMENT:   Consult Hip fracture protocol  ASSESSMENT:   83 y.o. Female with medical history significant of dementia, hypertension, hypothyroidism, chronic kidney disease-stage III, under hospice care currently, who presents with right hip pain after fall.  S/p Procedure(s) (LRB) on 08/19/16: INTRAMEDULLARY (IM) NAIL FEMORAL (Right)  Pt sleeping soundly at time of visit. RD did not wake.   Per RN, pt is very sleepy this AM. She refused breakfast secondary to lethargy. Pt to receive blood transfusion today and RN is hopeful that she will "perk up" after receiving blood. Also awaiting PT evaluation.   Due to diet advancement, RD will add Ensure supplements (like vanilla flavor) to optimize nutritional status. Pt is followed by hospice at home.   Labs reviewed.   Diet Order:  Diet regular Room service appropriate? Yes; Fluid consistency: Thin  Skin:  Reviewed, no issues  Last BM:  N/A  Height:   Ht Readings from Last 1 Encounters:  08/19/16 5\' 5"  (1.651 m)    Weight:   Wt Readings from Last 1 Encounters:  08/19/16 102 lb (46.3 kg)    Ideal Body Weight:  56.8 kg  BMI:  Body mass index is 16.97 kg/m.  Estimated Nutritional Needs:   Kcal:  1200-1400  Protein:  60-70 gm  Fluid:  >/= 1.5 L  EDUCATION NEEDS:   No education needs identified at this time  Hailey Taylor A. Mayford KnifeWilliams, RD, LDN, CDE Pager: [phone] After hours  Pager: [phone]

## 2016-08-20 NOTE — Evaluation (Signed)
Physical Therapy Evaluation Patient Details Name: Hailey Taylor MRN: [id number] DOB: [birth year] Today's Date: 08/20/2016   History of Present Illness  Presents with acute displaced RIGHT femur intertrochanteric fracture and recieved intramedullary nail femoral procedure (08/19/16). Medical history significant of dementia, hypertension, hypothyroidism, and chronic kidney disease-stage III.  Clinical Impression  Patient is s/p above surgery resulting in functional limitations due to the deficits listed below (see PT Problem List). Pt was receiving blood transfusion during treatment session as okayed by nursing however as shift changed new nurse did not want patient up to the chair in case she needed to replace the Iv. As a result PT stood up for gown change and to be washed up and sat back down and was returned to supine max Ax2.  Pt was mod Ax2 for supine to sit and sit to stand to the RW with PWB 50% on R Le.  Patient will benefit from skilled PT to increase their independence and safety with mobility to allow discharge to the venue listed below.      Follow Up Recommendations SNF    Equipment Recommendations   (TBD)    Recommendations for Other Services OT consult     Precautions / Restrictions Precautions Precautions: Other (comment) (Partial WB (50%)) Precaution Comments: PWB 50% R LE Restrictions Weight Bearing Restrictions: Yes RLE Weight Bearing: Partial weight bearing RLE Partial Weight Bearing Percentage or Pounds: 50%      Mobility  Bed Mobility Overal bed mobility: Needs Assistance Bed Mobility: Supine to Sit;Sit to Supine     Supine to sit: Mod assist;+2 for physical assistance;HOB elevated Sit to supine: Max assist;+2 for physical assistance;HOB elevated   General bed mobility comments: decreased processing made vc difficult and thus required more assistance  Transfers Overall transfer level: Needs assistance Equipment used: Rolling walker (2 wheeled) Transfers:  Sit to/from Stand Sit to Stand: Mod assist;+2 physical assistance;From elevated surface         General transfer comment: able to maintain PWB but requires assist of 2 to come to standing.      Balance Overall balance assessment: Needs assistance;History of Falls Sitting-balance support: Bilateral upper extremity supported;Feet supported Sitting balance-Leahy Scale: Fair     Standing balance support: Bilateral upper extremity supported Standing balance-Leahy Scale: Poor                               Pertinent Vitals/Pain Pain Assessment: Faces Faces Pain Scale: Hurts even more Pain Descriptors / Indicators: Crying;Operative site guarding  VSS    Home Living Family/patient expects to be discharged to:: Private residence Living Arrangements: Children Available Help at Discharge: Skilled Nursing Facility                  Prior Function Level of Independence: Needs assistance   Gait / Transfers Assistance Needed: walks with RW in house, cane in the community  ADL's / Homemaking Assistance Needed: Hospice/daughter A with bathing and dressing; pt able to perform grooming and self feeding with supervision and set up        Hand Dominance   Dominant Hand: Right    Extremity/Trunk Assessment   Upper Extremity Assessment Upper Extremity Assessment: Overall WFL for tasks assessed    Lower Extremity Assessment Lower Extremity Assessment: Defer to PT evaluation LLE Deficits / Details: decreased ROM of hip and knee LLE: Unable to fully assess due to pain       Communication  Communication: Expressive difficulties;Other (comment) (Decreased cognition)  Cognition Arousal/Alertness: Awake/alert Behavior During Therapy: Anxious Overall Cognitive Status: History of cognitive impairments - at baseline                 General Comments: Pt required verbal and tactile cues and benefits from encouragment/reassurance    General Comments General  comments (skin integrity, edema, etc.): Pt IV was leaking prior to OT visit. Communicated with RN. RN asked if we sit<>stand to change bed pad and then return pt to bed to finish IV.        Assessment/Plan    PT Assessment Patient needs continued PT services  PT Problem List Decreased strength;Decreased range of motion;Decreased activity tolerance;Decreased mobility;Decreased cognition;Decreased knowledge of precautions;Pain       PT Treatment Interventions DME instruction;Gait training;Therapeutic activities;Therapeutic exercise;Functional mobility training;Balance training;Patient/family education    PT Goals (Current goals can be found in the Care Plan section)  Acute Rehab PT Goals Patient Stated Goal: walk again PT Goal Formulation: With patient/family Time For Goal Achievement: 09/03/16 Potential to Achieve Goals: Fair    Frequency Min 3X/week   Barriers to discharge        Co-evaluation   Reason for Co-Treatment: For patient/therapist safety PT goals addressed during session: Mobility/safety with mobility OT goals addressed during session: ADL's and self-care       End of Session Equipment Utilized During Treatment: Gait belt Activity Tolerance: Patient limited by pain;Treatment limited secondary to agitation Patient left: in bed;with call bell/phone within reach;with family/visitor present Nurse Communication: Mobility status (nursing aware of leaking IV ) PT Visit Diagnosis: Muscle weakness (generalized) (M62.81);History of falling (Z91.81);Pain;Difficulty in walking, not elsewhere classified (R26.2) Pain - Right/Left: Right Pain - part of body: Hip         Time: 1610-96041508-1602 PT Time Calculation (min) (ACUTE ONLY): 54 min   Charges:   PT Evaluation $PT Eval Moderate Complexity: 1 Procedure PT Treatments $Therapeutic Activity: 8-22 mins   PT G Codes:         Elon Alaslizabeth B Van Fleet 08/20/2016, 5:28 PM  Lanora ManisElizabeth B. Beverely RisenVan Fleet PT, DPT Acute  Rehabilitation  [phone] Pager [phone]

## 2016-08-21 ENCOUNTER — Encounter (HOSPITAL_COMMUNITY): Payer: Self-pay | Admitting: Orthopedic Surgery

## 2016-08-21 DIAGNOSIS — D62 Acute posthemorrhagic anemia: Secondary | ICD-10-CM

## 2016-08-21 LAB — BASIC METABOLIC PANEL
Anion gap: 5 (ref 5–15)
BUN: 23 mg/dL — AB (ref 6–20)
CO2: 28 mmol/L (ref 22–32)
Calcium: 8.6 mg/dL — ABNORMAL LOW (ref 8.9–10.3)
Chloride: 103 mmol/L (ref 101–111)
Creatinine, Ser: 0.78 mg/dL (ref 0.44–1.00)
GFR calc Af Amer: >60 mL/min (ref >60)
GLUCOSE: 128 mg/dL — AB (ref 65–99)
POTASSIUM: 3 mmol/L — AB (ref 3.5–5.1)
Sodium: 136 mmol/L (ref 135–145)

## 2016-08-21 LAB — BPAM RBC
BLOOD PRODUCT EXPIRATION DATE: 201803282359
Blood Product Expiration Date: 201803202359
Blood Product Expiration Date: 201803252359
ISSUE DATE / TIME: 201803071511
ISSUE DATE / TIME: 201803081116
ISSUE DATE / TIME: 201803081355
UNIT TYPE AND RH: 6200
Unit Type and Rh: 6200
Unit Type and Rh: 6200

## 2016-08-21 LAB — TYPE AND SCREEN
ABO/RH(D): A POS
ANTIBODY SCREEN: NEGATIVE
UNIT DIVISION: 0
Unit division: 0
Unit division: 0

## 2016-08-21 LAB — CBC
HEMATOCRIT: 28.8 % — AB (ref 36.0–46.0)
Hemoglobin: 9.7 g/dL — ABNORMAL LOW (ref 12.0–15.0)
MCH: 28.9 pg (ref 26.0–34.0)
MCHC: 33.7 g/dL (ref 30.0–36.0)
MCV: 85.7 fL (ref 78.0–100.0)
PLATELETS: 140 10*3/uL — AB (ref 150–400)
RBC: 3.36 MIL/uL — AB (ref 3.87–5.11)
RDW: 15.2 % (ref 11.5–15.5)
WBC: 7.6 10*3/uL (ref 4.0–10.5)

## 2016-08-21 MED ORDER — POLYETHYLENE GLYCOL 3350 17 G PO PACK: 17.0000 g | PACK | Freq: Every day | ORAL | 0 refills | Status: DC | PRN

## 2016-08-21 MED ORDER — ENSURE ENLIVE PO LIQD: 237.0000 mL | Freq: Three times a day (TID) | ORAL | 12 refills | Status: DC

## 2016-08-21 MED ORDER — FERROUS SULFATE 325 (65 FE) MG PO TABS: 325.0000 mg | ORAL_TABLET | Freq: Two times a day (BID) | ORAL | 3 refills | Status: DC

## 2016-08-21 MED ORDER — POTASSIUM CHLORIDE CRYS ER 20 MEQ PO TBCR
40.0000 meq | EXTENDED_RELEASE_TABLET | Freq: Once | ORAL | Status: AC
  Administered 2016-08-21: 40 meq via ORAL
  Filled 2016-08-21: qty 2

## 2016-08-21 NOTE — Anesthesia Postprocedure Evaluation (Addendum)
Anesthesia Post Note  Patient: Hailey Taylor  Procedure(s) Performed: Procedure(s) (LRB): INTRAMEDULLARY (IM) NAIL FEMORAL (Right)  Patient location during evaluation: PACU Anesthesia Type: Spinal Level of consciousness: oriented and awake and alert Pain management: pain level controlled Vital Signs Assessment: post-procedure vital signs reviewed and stable Respiratory status: spontaneous breathing, respiratory function stable and patient connected to nasal cannula oxygen Cardiovascular status: blood pressure returned to baseline and stable Postop Assessment: no headache and no backache Anesthetic complications: no       Last Vitals:  Vitals:   08/20/16 2204 08/21/16 0505  BP: 124/60 (!) 109/58  Pulse: 79 74  Resp: 17 17  Temp: 36.8 C 36.8 C    Last Pain:  Vitals:   08/21/16 0505  TempSrc: Oral  PainSc:                  Andres Escandon S

## 2016-08-21 NOTE — Progress Notes (Addendum)
Subjective: 2 Days Post-Op Procedure(s) (LRB): INTRAMEDULLARY (IM) NAIL FEMORAL (Right) Patient reports pain as mild. The patient is at her usual mental status with dementia. I spoke to the patient's daughter who was at the bedside.   Objective: Vital signs in last 24 hours: Temp:  [98 F (36.7 C)-99 F (37.2 C)] 98.3 F (36.8 C) (03/09 0505) Pulse Rate:  [74-87] 74 (03/09 0505) Resp:  [14-17] 17 (03/09 0505) BP: (108-126)/(49-63) 109/58 (03/09 0505) SpO2:  [93 %-95 %] 94 % (03/09 0505)  Intake/Output from previous day: 03/08 0701 - 03/09 0700 In: 1005.2 [P.O.:100; I.V.:139.2; Blood:666; IV Piggyback:100] Out: 650 [Urine:650] Intake/Output this shift: No intake/output data recorded.   Recent Labs  08/18/16 2237 08/19/16 1133 08/20/16 0410 08/20/16 1944 08/21/16 0949  HGB 9.2* 7.6* 7.3* 9.8* 9.7*    Recent Labs  08/20/16 0410 08/20/16 1944 08/21/16 0949  WBC 7.1  --  7.6  RBC 2.53*  --  3.36*  HCT 21.7* 29.1* 28.8*  PLT 153  --  140*    Recent Labs  08/20/16 0410 08/21/16 0949  NA 136 136  K 3.6 3.0*  CL 106 103  CO2 22 28  BUN 24* 23*  CREATININE 0.76 0.78  GLUCOSE 109* 128*  CALCIUM 8.2* 8.6*    Recent Labs  08/18/16 2237  INR 1.18  The patient is in no apparent pain. Right hip exam:  Neurovascular intact Sensation intact distally Intact pulses distally Dorsiflexion/Plantar flexion intact Incision: dressing C/D/I Compartment soft  Assessment/Plan: 2 Days Post-Op Procedure(s) (LRB): INTRAMEDULLARY (IM) NAIL FEMORAL (Right) Acute blood loss anemia, now stable. Plan: Aspirin 325 mg enteric-coated 1 daily 1 month postop for DVT prophylaxis. Continue oral iron for acute blood loss anemia. Partial weightbearing 50% on right. Up with therapy Discharge to SNF at any time once medically stable. Will need follow-up with Dr. Luiz BlareGraves in the office in 2 weeks. I spoke to the daughter about the disposition plan.  Korver Graybeal G 08/21/2016,  12:48 PM

## 2016-08-21 NOTE — Clinical Social Work Placement (Addendum)
   CLINICAL SOCIAL WORK PLACEMENT  NOTE  Date:  08/21/2016  Patient Details  Name: Hailey Taylor MRN: [id number] Date of Birth: 02/10/1922  Clinical Social Work is seeking post-discharge placement for this patient at the Skilled  Nursing Facility level of care (*CSW will initial, date and re-position this form in  chart as items are completed):  Yes   Patient/family provided with [facility] Clinical Social Work Department's list of facilities offering this level of care within the geographic area requested by the patient (or if unable, by the patient's family).  Yes   Patient/family informed of their freedom to choose among providers that offer the needed level of care, that participate in Medicare, Medicaid or managed care program needed by the patient, have an available bed and are willing to accept the patient.  Yes   Patient/family informed of [facility] ownership interest in [facility] and [facility], as well as of the fact that they are under no obligation to receive care at these facilities.  PASRR submitted to EDS on       PASRR number received on       Existing PASRR number confirmed on       FL2 transmitted to all facilities in geographic area requested by pt/family on       FL2 transmitted to all facilities within larger geographic area on       Patient informed that his/her managed care company has contracts with or will negotiate with certain facilities, including the following:            Patient/family informed of bed offers received.  [facility]     Physician recommends and patient chooses bed at      [facility].  Patient to be transferred to facility by  [facility]     Patient family notified on 08/22/2016 of transfer.  Name of family member notified:  Darel HongJudy  PHYSICIAN       Additional Comment:    _______________________________________________ Dominic PeaJeneya G McLean, LCSW 08/21/2016,  6:12 PM

## 2016-08-21 NOTE — Progress Notes (Signed)
[facility] RN Visit @ 8:00 am  This is a covered and related admission from 08/19/16, with a [facility] diagnosis of Alzheimer's, per Dr. Barbee ShropshireHertweck. Code status DNR. Patient was admitted for treatment for a right hip fracture, after sustaining a fall in her home.  Visited patient in room. No family present at time of visit. Patient resting comfortably and did not arouse to verbal stimuli.  PT evaluation completed yesterday, and recommendation is for skilled PT to increase her independence and safety with mobility. OT also evaluated and recommended SNF for further OT to increase independence in ADLs and functional mobility, as well as reduce caregiver burden.  [facility] social worker, Whitney visited with patient and daughter yesterday to discuss plans at discharge.  [facility] will need to be notified if patient decides to discharge to SNF, so appropriate paperwork can be completed.  Patient received 2 units of PRBC's yesterday and Hgb this morning increased to 9.8. Patient completed last dose of Ancef IV 3/8.  She has not required any PRN medication in the past 24 hours, per chart review.   [facility] will continue to follow and anticipate any discharge needs.  Please call with any hospice-related questions or concerns.  Thank you,  Hessie KnowsStacie Wilkinson, RN, BSN [facility] Hospital Liaison [phone]  All hospital liaison's are now on AMION.

## 2016-08-21 NOTE — Progress Notes (Signed)
Physical Therapy Treatment Patient Details Name: Hailey Taylor MRN: [id number] DOB: [birth year] Today's Date: 08/21/2016    History of Present Illness Presents with acute displaced RIGHT femur intertrochanteric fracture and recieved intramedullary nail femoral procedure (08/19/16). Medical history significant of dementia, hypertension, hypothyroidism, and chronic kidney disease-stage III.    PT Comments    Pt requires reassurance from her daughter but was able to participate in PT today. Pt mod Ax2 for supine to sit with help for LE management to floor as well as trunk to upright. Pt took 2 attempts but was able to stand for 20 sec with mod Ax2 for 1 person for R LE PWB maintenance and 1 for assist to maintain standing. Pt sat 5 minutes for her daughter to fix her hair and was able to complete LE exercises. Pt requires continued skilled PT for advancing bed mobility,transfers and gait to be able to be safe in her environment.     Follow Up Recommendations  SNF     Equipment Recommendations   (TBD at next facility)    Recommendations for Other Services       Precautions / Restrictions Precautions Precautions: Other (comment) (Partial WB (50%)) Restrictions Weight Bearing Restrictions: Yes RLE Weight Bearing: Partial weight bearing RLE Partial Weight Bearing Percentage or Pounds: 50%    Mobility  Bed Mobility Overal bed mobility: Needs Assistance Bed Mobility: Supine to Sit;Sit to Supine     Supine to sit: Mod assist;+2 for physical assistance;HOB elevated Sit to supine: Max assist;+2 for physical assistance;HOB elevated   General bed mobility comments: Pt required less suing today to get to the side of the bed   Transfers Overall transfer level: Needs assistance   Transfers: Sit to/from Stand Sit to Stand: From elevated surface;+2 physical assistance;Max assist (one person kept L foot elevated to maintain weight bearing )         General transfer comment: pt required 2  attempts to come to standing with Max A x1. Able to stand for 20 seconds before becoming distracted and needing to be sat back down.   Ambulation/Gait                 Stairs            Wheelchair Mobility    Modified Rankin (Stroke Patients Only)       Balance Overall balance assessment: Needs assistance;History of Falls Sitting-balance support: Bilateral upper extremity supported;Feet supported Sitting balance-Leahy Scale: Good Sitting balance - Comments: sat EoB for 5 minutes while daughter did her hair and PT challenged weight shift onto both hips   Standing balance support: Bilateral upper extremity supported Standing balance-Leahy Scale: Poor                      Cognition Arousal/Alertness: Awake/alert Behavior During Therapy: Anxious Overall Cognitive Status: History of cognitive impairments - at baseline                 General Comments: Requires constant reassurance from her daughter but able to follow very simple instructions    Exercises General Exercises - Lower Extremity Ankle Circles/Pumps: AROM;10 reps;Both;Seated Long Arc Quad: AROM;Right;AAROM;Left;5 reps;Seated Hip Flexion/Marching: AROM;Right;5 reps;Seated    General Comments        Pertinent Vitals/Pain Pain Assessment: Faces Faces Pain Scale: Hurts even more Pain Location: L hip  Pain Descriptors / Indicators: Sore;Guarding;Grimacing Pain Intervention(s): Monitored during session  VSS           PT  Goals (current goals can now be found in the care plan section) Acute Rehab PT Goals Patient Stated Goal: walk again PT Goal Formulation: With patient/family Potential to Achieve Goals: Fair Progress towards PT goals: Progressing toward goals    Frequency    Min 3X/week      PT Plan Current plan remains appropriate    Co-evaluation             End of Session Equipment Utilized During Treatment: Gait belt   Patient left: in bed;with call bell/phone  within reach;with family/visitor present Nurse Communication: Mobility status PT Visit Diagnosis: Other abnormalities of gait and mobility (R26.89);Muscle weakness (generalized) (M62.81);Pain Pain - Right/Left: Right Pain - part of body: Hip     Time: 9811-91471501-1534 PT Time Calculation (min) (ACUTE ONLY): 33 min  Charges:  $Therapeutic Exercise: 8-22 mins $Therapeutic Activity: 8-22 mins                    G Codes:       Elon Alaslizabeth B Van Fleet 08/21/2016, 4:21 PM  Courtney ParisElizabeth B. Beverely RisenVan Fleet PT, DPT Acute Rehabilitation  [phone] Pager [phone]

## 2016-08-21 NOTE — Care Management Note (Signed)
Case Management Note  Patient Details  Name: Hailey Taylor MRN: [id number] Date of Birth: 08/29/1921  Subjective/Objective:                    Action/Plan:   Expected Discharge Date:                  Expected Discharge Plan:  Skilled Nursing Facility  In-House Referral:  Clinical Social Work  Discharge planning Services     Post Acute Care Choice:    Choice offered to:     DME Arranged:    DME Agency:     HH Arranged:    HH Agency:     Status of Service:  In process, will continue to follow  If discussed at Long Length of Stay Meetings, dates discussed:    Additional Comments:  [name], Amirrah Quigley Marie, RN 08/21/2016, 8:44 AM

## 2016-08-21 NOTE — Op Note (Signed)
NAMShon Taylor:  Taylor, Hailey                ACCOUNT NO.:  [id number]  MEDICAL RECORD NO.:  [id number]  LOCATION:                                 FACILITY:  PHYSICIAN:  Harvie JuniorJohn L. Nimrod Wendt, M.D.        DATE OF BIRTH:  DATE OF PROCEDURE:  08/19/2016 DATE OF DISCHARGE:                              OPERATIVE REPORT   PREOPERATIVE DIAGNOSIS:  Comminuted intertrochanteric hip fracture, right.  POSTOPERATIVE DIAGNOSIS:  Comminuted intertrochanteric hip fracture, right.  PROCEDURE: 1. Intramedullary fixation of right intertrochanteric hip fracture     with a Biomet trochanteric nail 11 x 380 mm with a lag screw     proximally and a single distal interlock. 2. Interpretation of multiple intraoperative fluoroscopic images.  SURGEON:  Harvie JuniorJohn L. Axil Copeman, M.D.  ASSISTANT:  Marshia LyJames Bethune, P.A.  ANESTHESIA:  General.  BRIEF HISTORY:  The patient is a 83 year old female, who lives with her daughter, who is pleasantly demented, who fell yesterday and suffered an intertrochanteric hip fracture, __________.  After long discussion of treatment options, including the possibility of nonoperative treatment, she was taken to the operating room for intramedullary nail fixation. It is essentially a palliative surgery as much as anything.  They certainly understood the risks of bleeding, infection, need for further surgery, failure of the surgery to work and they certainly were aware of the risk of death at the time of surgery in the perioperative period. The patient initially had a hemoglobin of 9.2 on admission, but after hydration her hemoglobin was measured at 7.5.  We gave her 2 units in the holding area prior to coming back to the OR just to make sure that she was hemodynamically adequate.  DESCRIPTION OF PROCEDURE:  The patient was taken to the operative room. After adequate anesthesia was obtained with a spinal anesthetic, the patient was placed supine on the operating table.  She was moved onto the Hana  bed, arms were onto the fracture table.  Once this was completed, attention was turned to the right hip where after routine prep and drape, an incision was made for an intramedullary nail. Subcutaneous tissue down the level of the greater troch and a guidewire was advanced down the __________ portion of the femur on both AP and lateral fluoro, over-reamed to 12.5, an 11 x 380 mm rod was advanced in the appropriate position.  Guidewire was placed dead center in the head and shooting inferiorly for bone and this gave us excellent bite and fixation.  Once this was achieved, the locking mechanism was turned all the way to locking and then backed off a quarter for compression.  Went distally and put in a single interlocking screw under freehand technique to control rotation.  Once that was completed, her wounds were irrigated, suctioned dry, closed in layers. Final fluoroscopic images were taken to make sure appropriateness of the rod, reduction, location, etc.  Once this was done, the patient was taken to recovery and she was noted to be in satisfactory condition. Estimated blood loss for procedure was 150 mL.     Harvie JuniorJohn L. Jeroline Wolbert, M.D.   ______________________________ Harvie JuniorJohn L. Kwanza Cancelliere, M.D.    Ranae PlumberJLG/MEDQ  D:  08/19/2016  T:  08/19/2016  Job:  [id number]  cc:   Harvie Junior, M.D.

## 2016-08-21 NOTE — Discharge Summary (Signed)
Physician Discharge Summary   Patient ID: AIZZA SANTIAGO MRN: [id number] DOB/AGE: June 23, 1921 83 y.o.  Admit date: 08/18/2016 Discharge date: 08/21/2016  Primary Care Physician:  No primary care provider on file.  Discharge Diagnoses:    . Displaced intertrochanteric fracture of right femur, (HCC) . Hypertension . Hypothyroidism . Fall . Late onset Alzheimer's disease without behavioral disturbance . Normocytic anemia . Hypokalemia . Leukocytosis   Consults:  Orthopedics, Dr Luiz Blare   Recommendations for Outpatient Follow-up:  1. Please repeat CBC/BMET at next visit 2. Orthopedics recommendations Plan: Aspirin 325 mg enteric-coated 1 daily 1 month postop for DVT prophylaxis. After that please continue aspirin 81 mg daily Continue oral iron for acute blood loss anemia. Partial weightbearing 50% on right. Up with therapy  DIET: Heart healthy diet    Allergies:   Allergies  Allergen Reactions  . Lisinopril Other (See Comments)    Causes acute renal failure     DISCHARGE MEDICATIONS: Current Discharge Medication List    START taking these medications   Details  aspirin EC 325 MG tablet Take 1 tablet (325 mg total) by mouth daily with breakfast. Qty: 30 tablet, Refills: 0    feeding supplement, ENSURE ENLIVE, (ENSURE ENLIVE) LIQD Take 237 mLs by mouth 3 (three) times daily between meals. Qty: 237 mL, Refills: 12    ferrous sulfate 325 (65 FE) MG tablet Take 1 tablet (325 mg total) by mouth 2 (two) times daily with a meal. Refills: 3    HYDROcodone-acetaminophen (NORCO) 5-325 MG tablet Take 1 tablet by mouth every 8 (eight) hours as needed for moderate pain. Qty: 40 tablet, Refills: 0    polyethylene glycol (MIRALAX / GLYCOLAX) packet Take 17 g by mouth daily as needed for mild constipation. Qty: 14 each, Refills: 0      CONTINUE these medications which have NOT CHANGED   Details  Cholecalciferol (VITAMIN D3) 5000 units CAPS Take 1 capsule by mouth daily.     EXELON 9.5 MG/24HR PLACE 1 PATCH (9.5 MG TOTAL) ONTO THE SKIN DAILY Qty: 90 patch, Refills: 1    hydrochlorothiazide (HYDRODIURIL) 25 MG tablet TAKE 1 TABLET (25 MG TOTAL) BY MOUTH DAILY. Qty: 90 tablet, Refills: 0    levothyroxine (SYNTHROID, LEVOTHROID) 50 MCG tablet TAKE 1 TABLET BY MOUTH DAILY BEFORE BREAKFAST Qty: 90 tablet, Refills: 1   Associated Diagnoses: Hypothyroidism due to acquired atrophy of thyroid    mupirocin ointment (BACTROBAN) 2 % Apply 1 application topically 4 (four) times daily. Qty: 30 g, Refills: 1    Potassium Bicarbonate 99 MG CAPS Take 99 mEq by mouth 1 day or 1 dose.      STOP taking these medications     aspirin 81 MG tablet      OVER THE COUNTER MEDICATION          Brief H and P: For complete details please refer to admission H and P, but in brief 83 year old female with dementia, hypertension, hypothyroidism, chronic kidney disease stage III, under hospice presented with a right hip fracture after mechanical fall. X-ray of right hip showed acute displaced RIGHT femur intertrochanteric fracture. Ortho, Dr. Luiz Blare was consulted.  Hospital Course:  Intertrochanteric Closed fracture of right hip [facility]):  - Orthopedics was consulted - Postop day #2, status post intramedullary femoral nail - PT recommended skilled nursing facility - Per orthopedics, continue aspirin 325 mg daily for one month, then continue aspirin 81 mg daily. Partial weightbearing 50% on right, up with therapy Continue oral iron for acute  blood loss anemia Patient received 2 units of packed RBCs during hospitalization postop. Follow-up in 2 weeks  Acute on chronic Anemia - Likely secondary to surgery, hemoglobin was 7.3 after surgery - Transfused 2 units packed RBCs, hemoglobin 9.7 at the time of discharge.  Fall:seems to be mechanical fall. -PTOT after the surgery, recommended skilled nursing facility  HTN: -continue HCTZ  Hypothyroidism: Last TSH was  4.180 -Continue home Synthroid  Late onset Alzheimer's disease without behavioral disturbance: -continue Exlon  Leukocytosis:no signs of infection.  - Likely due to stress induced to demargination. - chest x-ray negative - WBCs normalized, 7.6 at the time of discharge    Day of Discharge BP (!) 109/58 (BP Location: Left Arm)   Pulse 74   Temp 98.3 F (36.8 C) (Oral)   Resp 17   Ht 5\' 5"  (1.651 m)   Wt 46.3 kg (102 lb)   SpO2 94%   BMI 16.97 kg/m   Physical Exam: General: Alert and awake, has dementia HEENT: anicteric sclera, pupils reactive to light and accommodation CVS: S1-S2 clear no murmur rubs or gallops Chest: clear to auscultation bilaterally, no wheezing rales or rhonchi Abdomen: soft nontender, nondistended, normal bowel sounds Extremities: no cyanosis, clubbing or edema noted bilaterally   The results of significant diagnostics from this hospitalization (including imaging, microbiology, ancillary and laboratory) are listed below for reference.    LAB RESULTS: Basic Metabolic Panel:  Recent Labs Lab 08/20/16 0410 08/21/16 0949  NA 136 136  K 3.6 3.0*  CL 106 103  CO2 22 28  GLUCOSE 109* 128*  BUN 24* 23*  CREATININE 0.76 0.78  CALCIUM 8.2* 8.6*   Liver Function Tests: No results for input(s): AST, ALT, ALKPHOS, BILITOT, PROT, ALBUMIN in the last 168 hours. No results for input(s): LIPASE, AMYLASE in the last 168 hours. No results for input(s): AMMONIA in the last 168 hours. CBC:  Recent Labs Lab 08/18/16 2237  08/20/16 0410 08/20/16 1944 08/21/16 0949  WBC 22.2*  < > 7.1  --  7.6  NEUTROABS 19.6*  --   --   --   --   HGB 9.2*  < > 7.3* 9.8* 9.7*  HCT 28.1*  < > 21.7* 29.1* 28.8*  MCV 88.9  < > 85.8  --  85.7  PLT 265  < > 153  --  140*  < > = values in this interval not displayed. Cardiac Enzymes: No results for input(s): CKTOTAL, CKMB, CKMBINDEX, TROPONINI in the last 168 hours. BNP: Invalid input(s): POCBNP CBG: No results for  input(s): GLUCAP in the last 168 hours.  Significant Diagnostic Studies:  Ct Head Wo Contrast  Result Date: 08/18/2016 CLINICAL DATA:  [name]. EXAM: CT HEAD WITHOUT CONTRAST CT CERVICAL SPINE WITHOUT CONTRAST TECHNIQUE: Multidetector CT imaging of the head and cervical spine was performed following the standard protocol without intravenous contrast. Multiplanar CT image reconstructions of the cervical spine were also generated. COMPARISON:  None. FINDINGS: CT HEAD FINDINGS Brain: There is no intracranial hemorrhage, mass or evidence of acute infarction. There is remote lacunar infarction in the left centrum semiovale, left caudate, left putaminal and right insula. There is moderate generalized atrophy. There is moderate chronic microvascular ischemic change. There is no significant extra-axial fluid collection. No acute intracranial findings are evident. Vascular: No hyperdense vessel or unexpected calcification. Skull: Normal. Negative for fracture or focal lesion. Sinuses/Orbits: No acute finding. Other: None. CT CERVICAL SPINE  FINDINGS Alignment: Normal. Skull base and vertebrae: No acute fracture. No primary bone lesion or focal pathologic process. Soft tissues and spinal canal: No prevertebral fluid or swelling. No visible canal hematoma. Disc levels: Good preservation of intervertebral disc spaces with mild cervical degenerative disc disease at C5-6 and C6-7. Facet articulations are mildly arthritic, otherwise intact. Upper chest: Negative Other: Atherosclerotic calcification of the left vertebral artery. IMPRESSION: 1. No acute intracranial findings. There is moderate generalized atrophy and chronic appearing white matter hypodensities which likely represent small vessel ischemic disease. Multiple remote lacunar infarctions. 2. Negative for acute cervical spine fracture Electronically Signed   By: Ellery Plunk M.D.   On: 08/18/2016 22:36   Ct  Cervical Spine Wo Contrast  Result Date: 08/18/2016 CLINICAL DATA:  Larey Seat at home when she missed the chair she was trying to sit on. EXAM: CT HEAD WITHOUT CONTRAST CT CERVICAL SPINE WITHOUT CONTRAST TECHNIQUE: Multidetector CT imaging of the head and cervical spine was performed following the standard protocol without intravenous contrast. Multiplanar CT image reconstructions of the cervical spine were also generated. COMPARISON:  None. FINDINGS: CT HEAD FINDINGS Brain: There is no intracranial hemorrhage, mass or evidence of acute infarction. There is remote lacunar infarction in the left centrum semiovale, left caudate, left putaminal and right insula. There is moderate generalized atrophy. There is moderate chronic microvascular ischemic change. There is no significant extra-axial fluid collection. No acute intracranial findings are evident. Vascular: No hyperdense vessel or unexpected calcification. Skull: Normal. Negative for fracture or focal lesion. Sinuses/Orbits: No acute finding. Other: None. CT CERVICAL SPINE FINDINGS Alignment: Normal. Skull base and vertebrae: No acute fracture. No primary bone lesion or focal pathologic process. Soft tissues and spinal canal: No prevertebral fluid or swelling. No visible canal hematoma. Disc levels: Good preservation of intervertebral disc spaces with mild cervical degenerative disc disease at C5-6 and C6-7. Facet articulations are mildly arthritic, otherwise intact. Upper chest: Negative Other: Atherosclerotic calcification of the left vertebral artery. IMPRESSION: 1. No acute intracranial findings. There is moderate generalized atrophy and chronic appearing white matter hypodensities which likely represent small vessel ischemic disease. Multiple remote lacunar infarctions. 2. Negative for acute cervical spine fracture Electronically Signed   By: Ellery Plunk M.D.   On: 08/18/2016 22:36   Dg Chest Portable 1 View  Result Date: 08/18/2016 CLINICAL DATA:  Right  hip fracture EXAM: PORTABLE CHEST 1 VIEW COMPARISON:  None. FINDINGS: The lungs are grossly clear. Mild left hemidiaphragm elevation. No large effusion. No pneumothorax. Normal mediastinal contours. Normal pulmonary vasculature. No displaced fractures. IMPRESSION: No acute cardiopulmonary findings. Electronically Signed   By: Ellery Plunk M.D.   On: 08/18/2016 23:04   Dg C-arm 1-60 Min  Result Date: 08/19/2016 CLINICAL DATA:  83 y/o F; intramedullary nail placement of the right femur. EXAM: RIGHT FEMUR 2 VIEWS; DG C-ARM 61-120 MIN COMPARISON:  Right lower extremity radiographs dated 08/18/2016. FINDINGS: Intramedullary nail with proximal compression screw and distal interlocking threaded screw traversing intertrochanteric fracture. Fluoro time is 45 seconds. IMPRESSION: Intraoperative fluoroscopy for nail fixation of intertrochanteric right femur fracture. Electronically Signed   By: Mitzi Hansen M.D.   On: 08/19/2016 20:43   Dg Hip Unilat W Or Wo Pelvis 2-3 Views Right  Result Date: 08/18/2016 CLINICAL DATA:  RIGHT hip pain after fall tonight. History of dementia. EXAM: DG HIP (WITH OR WITHOUT PELVIS) 2-3V RIGHT COMPARISON:  None. FINDINGS: Acute comminuted RIGHT intertrochanteric fracture with impaction, slight varus angulation distal bony fragments. No dislocation. Status post  LEFT hip hemiarthroplasty. Osteopenia without destructive bony lesions. Stool distended rectum. Moderate vascular calcifications. IMPRESSION: Acute displaced RIGHT femur intertrochanteric fracture. Electronically Signed   By: Awilda Metro M.D.   On: 08/18/2016 22:13   Dg Femur, Min 2 Views Right  Result Date: 08/19/2016 CLINICAL DATA:  83 y/o F; intramedullary nail placement of the right femur. EXAM: RIGHT FEMUR 2 VIEWS; DG C-ARM 61-120 MIN COMPARISON:  Right lower extremity radiographs dated 08/18/2016. FINDINGS: Intramedullary nail with proximal compression screw and distal interlocking threaded screw  traversing intertrochanteric fracture. Fluoro time is 45 seconds. IMPRESSION: Intraoperative fluoroscopy for nail fixation of intertrochanteric right femur fracture. Electronically Signed   By: Mitzi Hansen M.D.   On: 08/19/2016 20:43    2D ECHO:   Disposition and Follow-up: Discharge Instructions    Diet - low sodium heart healthy    Complete by:  As directed    Increase activity slowly    Complete by:  As directed    Partial weight bearing    Complete by:  As directed    % Body Weight:  50 %   Laterality:  right   Extremity:  Lower       DISPOSITION: SNF   DISCHARGE FOLLOW-UP Follow-up Information    GRAVES,JOHN L, MD. Schedule an appointment as soon as possible for a visit in 2 week(s).   Specialty:  Orthopedic Surgery Contact information: [address] [place] [address] [phone]            Time spent on Discharge: 28 mins   Signed:   Saphia Vanderford M.D. [facility] 08/21/2016, 1:04 PM Pager: [phone]

## 2016-08-21 NOTE — Clinical Social Work Note (Signed)
Clinical Social Work Assessment  Patient Details  Name: Hailey Taylor MRN: [id number] Date of Birth: 03-Nov-1921  Date of referral:  08/21/16               Reason for consult:  Discharge Planning                Permission sought to share information with:  Family Supports Permission granted to share information::     Name::     Otis Brace  Agency::     Relationship::  Daughter  Contact Information:  [phone]  Housing/Transportation Living arrangements for the past 2 months:  [place] of Information:  Adult Children Otis Brace) Patient Interpreter Needed:  None Criminal Activity/Legal Involvement Pertinent to Current Situation/Hospitalization:  No - Comment as needed Significant Relationships:  Adult Children Lives with:  Adult Children Otis Brace) Do you feel safe going back to the place where you live?  Yes Need for family participation in patient care:  Yes (Comment)  Care giving concerns: No caregiving concerns identified.    Social Worker assessment / plan:  CSW met with pt and daughter-[name] Strawn at bedside to address consult for new SNF. Pt sleeping soundly in bed. CSW introduced herself and explained role of social work. P/T is recommending STR at SNF. CSW provided SNF listing for review. Daughter agreeable to SNF. Preference is [facility]. Pt has been to STR before for similar injury. Pt lives with daughter. Daughter very involved and supportive of pt.    CSW will send FL-2 to SNFs and follow up on potential bed offer. CSW will continue to follow.   Employment status:  Retired Forensic scientist:  Medicare PT Recommendations:  [place] / Referral to community resources:  Center  Patient/Family's Response to care: Pt's daughter was thankful and appreciative of CSW support.  Patient/Family's Understanding of and Emotional Response to Diagnosis, Current Treatment, and Prognosis:  Pt's daughter  understands that pt will benefit from rehab prior to returning home. Daughter is "prayerful" that she will choose the best facility for pt.   Emotional Assessment Appearance:  Appears stated age Attitude/Demeanor/Rapport:  Unable to Assess Affect (typically observed):  Unable to Assess Orientation:  Oriented to Self Alcohol / Substance use:  Other Psych involvement (Current and /or in the community):  No (Comment)  Discharge Needs  Concerns to be addressed:  Care Coordination Readmission within the last 30 days:  No Current discharge risk:  Dependent with Mobility Barriers to Discharge:  Continued Medical Work up   [employer], LCSW 08/21/2016, 6:06 PM

## 2016-08-22 DIAGNOSIS — S72141A Displaced intertrochanteric fracture of right femur, initial encounter for closed fracture: Principal | ICD-10-CM

## 2016-08-22 DIAGNOSIS — W19XXXA Unspecified fall, initial encounter: Secondary | ICD-10-CM

## 2016-08-22 DIAGNOSIS — G301 Alzheimer's disease with late onset: Secondary | ICD-10-CM | POA: Diagnosis not present

## 2016-08-22 DIAGNOSIS — R2681 Unsteadiness on feet: Secondary | ICD-10-CM | POA: Diagnosis not present

## 2016-08-22 DIAGNOSIS — S728X9A Other fracture of unspecified femur, initial encounter for closed fracture: Secondary | ICD-10-CM | POA: Diagnosis not present

## 2016-08-22 DIAGNOSIS — M25551 Pain in right hip: Secondary | ICD-10-CM | POA: Diagnosis not present

## 2016-08-22 DIAGNOSIS — R2689 Other abnormalities of gait and mobility: Secondary | ICD-10-CM | POA: Diagnosis not present

## 2016-08-22 DIAGNOSIS — N3942 Incontinence without sensory awareness: Secondary | ICD-10-CM | POA: Diagnosis not present

## 2016-08-22 DIAGNOSIS — N390 Urinary tract infection, site not specified: Secondary | ICD-10-CM | POA: Diagnosis not present

## 2016-08-22 DIAGNOSIS — I10 Essential (primary) hypertension: Secondary | ICD-10-CM | POA: Diagnosis not present

## 2016-08-22 DIAGNOSIS — N183 Chronic kidney disease, stage 3 (moderate): Secondary | ICD-10-CM | POA: Diagnosis not present

## 2016-08-22 DIAGNOSIS — S72001A Fracture of unspecified part of neck of right femur, initial encounter for closed fracture: Secondary | ICD-10-CM | POA: Diagnosis not present

## 2016-08-22 DIAGNOSIS — Z9181 History of falling: Secondary | ICD-10-CM | POA: Diagnosis not present

## 2016-08-22 DIAGNOSIS — R54 Age-related physical debility: Secondary | ICD-10-CM | POA: Diagnosis not present

## 2016-08-22 DIAGNOSIS — E876 Hypokalemia: Secondary | ICD-10-CM

## 2016-08-22 DIAGNOSIS — M6281 Muscle weakness (generalized): Secondary | ICD-10-CM | POA: Diagnosis not present

## 2016-08-22 DIAGNOSIS — F0280 Dementia in other diseases classified elsewhere without behavioral disturbance: Secondary | ICD-10-CM | POA: Diagnosis not present

## 2016-08-22 DIAGNOSIS — R262 Difficulty in walking, not elsewhere classified: Secondary | ICD-10-CM | POA: Diagnosis not present

## 2016-08-22 DIAGNOSIS — D62 Acute posthemorrhagic anemia: Secondary | ICD-10-CM | POA: Diagnosis not present

## 2016-08-22 DIAGNOSIS — D649 Anemia, unspecified: Secondary | ICD-10-CM | POA: Diagnosis not present

## 2016-08-22 DIAGNOSIS — R52 Pain, unspecified: Secondary | ICD-10-CM | POA: Diagnosis not present

## 2016-08-22 DIAGNOSIS — D72829 Elevated white blood cell count, unspecified: Secondary | ICD-10-CM | POA: Diagnosis not present

## 2016-08-22 DIAGNOSIS — S72141D Displaced intertrochanteric fracture of right femur, subsequent encounter for closed fracture with routine healing: Secondary | ICD-10-CM | POA: Diagnosis not present

## 2016-08-22 DIAGNOSIS — E569 Vitamin deficiency, unspecified: Secondary | ICD-10-CM | POA: Diagnosis not present

## 2016-08-22 DIAGNOSIS — G8911 Acute pain due to trauma: Secondary | ICD-10-CM | POA: Diagnosis not present

## 2016-08-22 DIAGNOSIS — E039 Hypothyroidism, unspecified: Secondary | ICD-10-CM | POA: Diagnosis not present

## 2016-08-22 DIAGNOSIS — E860 Dehydration: Secondary | ICD-10-CM | POA: Diagnosis not present

## 2016-08-22 DIAGNOSIS — R278 Other lack of coordination: Secondary | ICD-10-CM | POA: Diagnosis not present

## 2016-08-22 DIAGNOSIS — K5900 Constipation, unspecified: Secondary | ICD-10-CM | POA: Diagnosis not present

## 2016-08-22 DIAGNOSIS — Z9889 Other specified postprocedural states: Secondary | ICD-10-CM | POA: Diagnosis not present

## 2016-08-22 DIAGNOSIS — G309 Alzheimer's disease, unspecified: Secondary | ICD-10-CM | POA: Diagnosis not present

## 2016-08-22 DIAGNOSIS — E46 Unspecified protein-calorie malnutrition: Secondary | ICD-10-CM | POA: Diagnosis not present

## 2016-08-22 LAB — BASIC METABOLIC PANEL
Anion gap: 10 (ref 5–15)
BUN: 24 mg/dL — AB (ref 6–20)
CALCIUM: 8.4 mg/dL — AB (ref 8.9–10.3)
CHLORIDE: 98 mmol/L — AB (ref 101–111)
CO2: 28 mmol/L (ref 22–32)
CREATININE: 0.67 mg/dL (ref 0.44–1.00)
GFR calc non Af Amer: >60 mL/min (ref >60)
GLUCOSE: 101 mg/dL — AB (ref 65–99)
Potassium: 3.3 mmol/L — ABNORMAL LOW (ref 3.5–5.1)
Sodium: 136 mmol/L (ref 135–145)

## 2016-08-22 LAB — CBC
HCT: 28 % — ABNORMAL LOW (ref 36.0–46.0)
Hemoglobin: 9.3 g/dL — ABNORMAL LOW (ref 12.0–15.0)
MCH: 28.8 pg (ref 26.0–34.0)
MCHC: 33.2 g/dL (ref 30.0–36.0)
MCV: 86.7 fL (ref 78.0–100.0)
Platelets: 162 10*3/uL (ref 150–400)
RBC: 3.23 MIL/uL — ABNORMAL LOW (ref 3.87–5.11)
RDW: 14.9 % (ref 11.5–15.5)
WBC: 8.4 10*3/uL (ref 4.0–10.5)

## 2016-08-22 LAB — URINALYSIS, ROUTINE W REFLEX MICROSCOPIC
BILIRUBIN URINE: NEGATIVE
GLUCOSE, UA: NEGATIVE mg/dL
Hgb urine dipstick: NEGATIVE
KETONES UR: NEGATIVE mg/dL
Leukocytes, UA: NEGATIVE
Nitrite: NEGATIVE
PH: 5 (ref 5.0–8.0)
PROTEIN: NEGATIVE mg/dL
Specific Gravity, Urine: 1.017 (ref 1.005–1.030)

## 2016-08-22 LAB — MAGNESIUM: Magnesium: 1.5 mg/dL — ABNORMAL LOW (ref 1.7–2.4)

## 2016-08-22 MED ORDER — POTASSIUM CHLORIDE CRYS ER 20 MEQ PO TBCR
40.0000 meq | EXTENDED_RELEASE_TABLET | Freq: Once | ORAL | Status: AC
  Administered 2016-08-22: 40 meq via ORAL
  Filled 2016-08-22: qty 2

## 2016-08-22 MED ORDER — HYDROCODONE-ACETAMINOPHEN 5-325 MG PO TABS: 1.0000 | ORAL_TABLET | Freq: Three times a day (TID) | ORAL | 0 refills | Status: DC | PRN

## 2016-08-22 MED ORDER — METHOCARBAMOL 500 MG PO TABS: 500.0000 mg | ORAL_TABLET | Freq: Three times a day (TID) | ORAL | Status: DC | PRN

## 2016-08-22 NOTE — Progress Notes (Signed)
Subjective: 3 Days Post-Op Procedure(s) (LRB): INTRAMEDULLARY (IM) NAIL FEMORAL (Right) Patient reports pain as mild.  The patient is at her usual mental status. She appears comfortable.  Objective: Vital signs in last 24 hours: Temp:  [97.5 F (36.4 C)-98.2 F (36.8 C)] 97.5 F (36.4 C) (03/10 0409) Pulse Rate:  [85-88] 88 (03/10 0409) Resp:  [17-19] 19 (03/10 0409) BP: (97-116)/(54-58) 97/56 (03/10 0409) SpO2:  [97 %-98 %] 98 % (03/10 0409)  Intake/Output from previous day: 03/09 0701 - 03/10 0700 In: 80  Out: 975 [Urine:975] Intake/Output this shift: No intake/output data recorded.   Recent Labs  08/19/16 1133 08/20/16 0410 08/20/16 1944 08/21/16 0949 08/22/16 0453  HGB 7.6* 7.3* 9.8* 9.7* 9.3*    Recent Labs  08/21/16 0949 08/22/16 0453  WBC 7.6 8.4  RBC 3.36* 3.23*  HCT 28.8* 28.0*  PLT 140* 162    Recent Labs  08/21/16 0949 08/22/16 0453  NA 136 136  K 3.0* 3.3*  CL 103 98*  CO2 28 28  BUN 23* 24*  CREATININE 0.78 0.67  GLUCOSE 128* 101*  CALCIUM 8.6* 8.4*   No results for input(s): LABPT, INR in the last 72 hours. Right hip exam: Surgical wounds are benign. Staples intact. No drainage. Right calf is soft and nontender. Moves her right foot actively without difficulty. Distal pulses are 1+. Good sensation distally.  Assessment/Plan: 3 Days Post-Op Procedure(s) (LRB): INTRAMEDULLARY (IM) NAIL FEMORAL (Right) Acute blood loss anemia, currently stable. Plan: Okay from orthopedic viewpoint to discharge to skilled nursing facility. She will be partial weightbearing 50% on the right. Aspirin 325 mg enteric-coated 1 daily with food for DVT prophylaxis 1 month. He is given Rx for Norco 5 mg one every 8 hours as needed for pain. Use sparingly. Will need follow-up with Dr. Luiz BlareGraves in the office in 2 weeks.   Shalayne Leach G 08/22/2016, 8:41 AM

## 2016-08-22 NOTE — Discharge Summary (Addendum)
Physician Discharge Summary   Patient ID: Randol KernFaye D Klammer MRN: [id number] DOB/AGE: 80/01/1922 83 y.o.  Admit date: 08/18/2016 Discharge date: 08/22/2016  Primary Care Physician:  No primary care provider on file.  Discharge Diagnoses:    . Displaced intertrochanteric fracture of right femur, (HCC) . Hypertension . Hypothyroidism . Fall . Late onset Alzheimer's disease without behavioral disturbance . Normocytic anemia . Hypokalemia . Leukocytosis   Consults:  Orthopedics, Dr Luiz BlareGraves   Recommendations for Outpatient Follow-up:  1. Please repeat CBC/BMET at next visit 2. Orthopedics recommendations Plan: Aspirin 325 mg enteric-coated 1 daily 1 month postop for DVT prophylaxis. After that please continue aspirin 81 mg daily Continue oral iron for acute blood loss anemia. Partial weightbearing 50% on right. Up with therapy Will need follow-up with Dr. Luiz BlareGraves in the office in 2 weeks  DIET: Heart healthy diet    Allergies:   Allergies  Allergen Reactions  . Lisinopril Other (See Comments)    Causes acute renal failure     DISCHARGE MEDICATIONS: Current Discharge Medication List    START taking these medications   Details  aspirin EC 325 MG tablet Take 1 tablet (325 mg total) by mouth daily with breakfast. Qty: 30 tablet, Refills: 0    feeding supplement, ENSURE ENLIVE, (ENSURE ENLIVE) LIQD Take 237 mLs by mouth 3 (three) times daily between meals. Qty: 237 mL, Refills: 12    ferrous sulfate 325 (65 FE) MG tablet Take 1 tablet (325 mg total) by mouth 2 (two) times daily with a meal. Refills: 3    HYDROcodone-acetaminophen (NORCO) 5-325 MG tablet Take 1 tablet by mouth every 8 (eight) hours as needed for moderate pain. Qty: 40 tablet, Refills: 0    methocarbamol (ROBAXIN) 500 MG tablet Take 1 tablet (500 mg total) by mouth every 8 (eight) hours as needed for muscle spasms.    polyethylene glycol (MIRALAX / GLYCOLAX) packet Take 17 g by mouth daily as needed for  mild constipation. Qty: 14 each, Refills: 0      CONTINUE these medications which have NOT CHANGED   Details  Cholecalciferol (VITAMIN D3) 5000 units CAPS Take 1 capsule by mouth daily.    EXELON 9.5 MG/24HR PLACE 1 PATCH (9.5 MG TOTAL) ONTO THE SKIN DAILY Qty: 90 patch, Refills: 1    levothyroxine (SYNTHROID, LEVOTHROID) 50 MCG tablet TAKE 1 TABLET BY MOUTH DAILY BEFORE BREAKFAST Qty: 90 tablet, Refills: 1   Associated Diagnoses: Hypothyroidism due to acquired atrophy of thyroid    mupirocin ointment (BACTROBAN) 2 % Apply 1 application topically 4 (four) times daily. Qty: 30 g, Refills: 1    Potassium Bicarbonate 99 MG CAPS Take 99 mEq by mouth 1 day or 1 dose.      STOP taking these medications     aspirin 81 MG tablet      hydrochlorothiazide (HYDRODIURIL) 25 MG tablet      OVER THE COUNTER MEDICATION          Brief H and P: For complete details please refer to admission H and P, but in brief 83 year old female with dementia, hypertension, hypothyroidism, chronic kidney disease stage III, under hospice presented with a right hip fracture after mechanical fall. X-ray of right hip showed acute displaced RIGHT femur intertrochanteric fracture. Ortho, Dr. Luiz BlareGraves was consulted.  Hospital Course:  Intertrochanteric Closed fracture of right hip [facility](HCC):  - Orthopedics was consulted - Postop day #2, status post intramedullary femoral nail - PT recommended skilled nursing facility - Per orthopedics, continue aspirin  325 mg daily for one month, then continue aspirin 81 mg daily. Partial weightbearing 50% on right, up with therapy Continue oral iron for acute blood loss anemia Patient received 2 units of packed RBCs during hospitalization postop.Hemoglobin improved from 7.3 postop to 9.3 Follow-up in 2 weeks  Acute on chronic Anemia - Likely secondary to surgery, hemoglobin was 7.3 after surgery - Transfused 2 units packed RBCs, hemoglobin 9.7>9.3  at the time of  discharge.  Fall:seems to be mechanical fall. -PTOT after the surgery, recommended skilled nursing facility  HTN: HCTZ has been discontinued due to low blood pressure and recurrent hypokalemia Doubt that this will be needed in the future  Hypothyroidism: Last TSH was 4.180 -Continue home Synthroid  Late onset Alzheimer's disease without behavioral disturbance: -continue Exlon  Leukocytosis:no signs of infection.  - Likely due to stress induced to demargination. - chest x-ray negative - WBCs normalized, 7.6 at the time of discharge    Day of Discharge BP (!) 97/56 (BP Location: Right Arm)   Pulse 88   Temp 97.5 F (36.4 C) (Oral)   Resp 19   Ht 5\' 5"  (1.651 m)   Wt 46.3 kg (102 lb)   SpO2 98%   BMI 16.97 kg/m   Physical Exam: General: Alert and awake, has dementia HEENT: anicteric sclera, pupils reactive to light and accommodation CVS: S1-S2 clear no murmur rubs or gallops Chest: clear to auscultation bilaterally, no wheezing rales or rhonchi Abdomen: soft nontender, nondistended, normal bowel sounds Extremities: no cyanosis, clubbing or edema noted bilaterally   The results of significant diagnostics from this hospitalization (including imaging, microbiology, ancillary and laboratory) are listed below for reference.    LAB RESULTS: Basic Metabolic Panel:  Recent Labs Lab 08/21/16 0949 08/22/16 0453  NA 136 136  K 3.0* 3.3*  CL 103 98*  CO2 28 28  GLUCOSE 128* 101*  BUN 23* 24*  CREATININE 0.78 0.67  CALCIUM 8.6* 8.4*   Liver Function Tests: No results for input(s): AST, ALT, ALKPHOS, BILITOT, PROT, ALBUMIN in the last 168 hours. No results for input(s): LIPASE, AMYLASE in the last 168 hours. No results for input(s): AMMONIA in the last 168 hours. CBC:  Recent Labs Lab 08/18/16 2237  08/21/16 0949 08/22/16 0453  WBC 22.2*  < > 7.6 8.4  NEUTROABS 19.6*  --   --   --   HGB 9.2*  < > 9.7* 9.3*  HCT 28.1*  < > 28.8* 28.0*  MCV 88.9  < >  85.7 86.7  PLT 265  < > 140* 162  < > = values in this interval not displayed. Cardiac Enzymes: No results for input(s): CKTOTAL, CKMB, CKMBINDEX, TROPONINI in the last 168 hours. BNP: Invalid input(s): POCBNP CBG: No results for input(s): GLUCAP in the last 168 hours.  Significant Diagnostic Studies:  Ct Head Wo Contrast  Result Date: 08/18/2016 CLINICAL DATA:  Larey Seat at home when she missed the chair she was trying to sit on. EXAM: CT HEAD WITHOUT CONTRAST CT CERVICAL SPINE WITHOUT CONTRAST TECHNIQUE: Multidetector CT imaging of the head and cervical spine was performed following the standard protocol without intravenous contrast. Multiplanar CT image reconstructions of the cervical spine were also generated. COMPARISON:  None. FINDINGS: CT HEAD FINDINGS Brain: There is no intracranial hemorrhage, mass or evidence of acute infarction. There is remote lacunar infarction in the left centrum semiovale, left caudate, left putaminal and right insula. There is moderate generalized atrophy. There is moderate chronic microvascular ischemic change. There is  no significant extra-axial fluid collection. No acute intracranial findings are evident. Vascular: No hyperdense vessel or unexpected calcification. Skull: Normal. Negative for fracture or focal lesion. Sinuses/Orbits: No acute finding. Other: None. CT CERVICAL SPINE FINDINGS Alignment: Normal. Skull base and vertebrae: No acute fracture. No primary bone lesion or focal pathologic process. Soft tissues and spinal canal: No prevertebral fluid or swelling. No visible canal hematoma. Disc levels: Good preservation of intervertebral disc spaces with mild cervical degenerative disc disease at C5-6 and C6-7. Facet articulations are mildly arthritic, otherwise intact. Upper chest: Negative Other: Atherosclerotic calcification of the left vertebral artery. IMPRESSION: 1. No acute intracranial findings. There is moderate generalized atrophy and chronic appearing white  matter hypodensities which likely represent small vessel ischemic disease. Multiple remote lacunar infarctions. 2. Negative for acute cervical spine fracture Electronically Signed   By: Ellery Plunk M.D.   On: 08/18/2016 22:36   Ct Cervical Spine Wo Contrast  Result Date: 08/18/2016 CLINICAL DATA:  Larey Seat at home when she missed the chair she was trying to sit on. EXAM: CT HEAD WITHOUT CONTRAST CT CERVICAL SPINE WITHOUT CONTRAST TECHNIQUE: Multidetector CT imaging of the head and cervical spine was performed following the standard protocol without intravenous contrast. Multiplanar CT image reconstructions of the cervical spine were also generated. COMPARISON:  None. FINDINGS: CT HEAD FINDINGS Brain: There is no intracranial hemorrhage, mass or evidence of acute infarction. There is remote lacunar infarction in the left centrum semiovale, left caudate, left putaminal and right insula. There is moderate generalized atrophy. There is moderate chronic microvascular ischemic change. There is no significant extra-axial fluid collection. No acute intracranial findings are evident. Vascular: No hyperdense vessel or unexpected calcification. Skull: Normal. Negative for fracture or focal lesion. Sinuses/Orbits: No acute finding. Other: None. CT CERVICAL SPINE FINDINGS Alignment: Normal. Skull base and vertebrae: No acute fracture. No primary bone lesion or focal pathologic process. Soft tissues and spinal canal: No prevertebral fluid or swelling. No visible canal hematoma. Disc levels: Good preservation of intervertebral disc spaces with mild cervical degenerative disc disease at C5-6 and C6-7. Facet articulations are mildly arthritic, otherwise intact. Upper chest: Negative Other: Atherosclerotic calcification of the left vertebral artery. IMPRESSION: 1. No acute intracranial findings. There is moderate generalized atrophy and chronic appearing white matter hypodensities which likely represent small vessel ischemic  disease. Multiple remote lacunar infarctions. 2. Negative for acute cervical spine fracture Electronically Signed   By: Ellery Plunk M.D.   On: 08/18/2016 22:36   Dg Chest Portable 1 View  Result Date: 08/18/2016 CLINICAL DATA:  Right hip fracture EXAM: PORTABLE CHEST 1 VIEW COMPARISON:  None. FINDINGS: The lungs are grossly clear. Mild left hemidiaphragm elevation. No large effusion. No pneumothorax. Normal mediastinal contours. Normal pulmonary vasculature. No displaced fractures. IMPRESSION: No acute cardiopulmonary findings. Electronically Signed   By: Ellery Plunk M.D.   On: 08/18/2016 23:04   Dg C-arm 1-60 Min  Result Date: 08/19/2016 CLINICAL DATA:  83 y/o F; intramedullary nail placement of the right femur. EXAM: RIGHT FEMUR 2 VIEWS; DG C-ARM 61-120 MIN COMPARISON:  Right lower extremity radiographs dated 08/18/2016. FINDINGS: Intramedullary nail with proximal compression screw and distal interlocking threaded screw traversing intertrochanteric fracture. Fluoro time is 45 seconds. IMPRESSION: Intraoperative fluoroscopy for nail fixation of intertrochanteric right femur fracture. Electronically Signed   By: Mitzi Hansen M.D.   On: 08/19/2016 20:43   Dg Hip Unilat W Or Wo Pelvis 2-3 Views Right  Result Date: 08/18/2016 CLINICAL DATA:  RIGHT hip pain after  fall tonight. History of dementia. EXAM: DG HIP (WITH OR WITHOUT PELVIS) 2-3V RIGHT COMPARISON:  None. FINDINGS: Acute comminuted RIGHT intertrochanteric fracture with impaction, slight varus angulation distal bony fragments. No dislocation. Status post LEFT hip hemiarthroplasty. Osteopenia without destructive bony lesions. Stool distended rectum. Moderate vascular calcifications. IMPRESSION: Acute displaced RIGHT femur intertrochanteric fracture. Electronically Signed   By: Awilda Metro M.D.   On: 08/18/2016 22:13   Dg Femur, Min 2 Views Right  Result Date: 08/19/2016 CLINICAL DATA:  83 y/o F; intramedullary nail  placement of the right femur. EXAM: RIGHT FEMUR 2 VIEWS; DG C-ARM 61-120 MIN COMPARISON:  Right lower extremity radiographs dated 08/18/2016. FINDINGS: Intramedullary nail with proximal compression screw and distal interlocking threaded screw traversing intertrochanteric fracture. Fluoro time is 45 seconds. IMPRESSION: Intraoperative fluoroscopy for nail fixation of intertrochanteric right femur fracture. Electronically Signed   By: Mitzi Hansen M.D.   On: 08/19/2016 20:43    2D ECHO:   Disposition and Follow-up: Discharge Instructions    Diet - low sodium heart healthy    Complete by:  As directed    Diet - low sodium heart healthy    Complete by:  As directed    Increase activity slowly    Complete by:  As directed    Increase activity slowly    Complete by:  As directed    Partial weight bearing    Complete by:  As directed    % Body Weight:  50 %   Laterality:  right   Extremity:  Lower       DISPOSITION: SNF   DISCHARGE FOLLOW-UP Follow-up Information    GRAVES,JOHN L, MD. Schedule an appointment as soon as possible for a visit in 2 week(s).   Specialty:  Orthopedic Surgery Contact information: [address] [place] [address] [phone]            Time spent on Discharge: 28 mins   Signed:   Richarda Overlie M.D. [facility] 08/22/2016, 8:15 AM Pager: [phone]

## 2016-08-22 NOTE — [place] FL2 (Signed)
[place] MEDICAID FL2 LEVEL OF CARE SCREENING TOOL     IDENTIFICATION  Patient Name: Hailey KernFaye D Niess Birthdate: 09/22/1921 Sex: female Admission Date (Current Location): 08/18/2016  [facility] [place]   Facility and Address[facility]  [facility] [place], [address], [place], [place] [address]      Provider Number: [id number]  Attending Physician Name and Address:  Richarda OverlieNayana Abrol, MD  Relative Name and Phone Number:       Current Level of Care: Hospital Recommended Level of Care: Skilled Nursing Facility Prior Approval Number:    Date Approved/Denied:   PASRR Number: [id number]  Discharge Plan: SNF    Current Diagnoses: Patient Active Problem List   Diagnosis Date Noted  . Acute blood loss anemia 08/21/2016  . Protein-calorie malnutrition, severe 08/19/2016  . Displaced intertrochanteric fracture of right femur, initial encounter for closed fracture (HCC) 08/18/2016  . Fall 08/18/2016  . Normocytic anemia 08/18/2016  . Hypokalemia 08/18/2016  . Leukocytosis 08/18/2016  . 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 Dehydration 11/25/2015  . Urinary incontinence without sensory awareness 11/25/2015  . Late onset Alzheimer's disease without behavioral disturbance 07/10/2015  . 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 Hypertension 10/08/2011  . Memory loss 10/08/2011  . Hypothyroidism 10/08/2011  . History of fracture of hip 10/18/2010    Orientation RESPIRATION BLADDER Height & Weight     Self  Normal Incontinent, Indwelling catheter (Urethal Catheter: tube size 14Fr, balloon size 10mL ) Weight: 102 lb (46.3 kg) Height:  5\' 5"  (165.1 cm)  BEHAVIORAL SYMPTOMS/MOOD NEUROLOGICAL BOWEL NUTRITION STATUS      Continent Diet  AMBULATORY STATUS COMMUNICATION OF NEEDS Skin   Extensive Assist Verbally Surgical wounds (Closed incision right hip, Hydrocolloid dressing)                       Personal Care Assistance  Level of Assistance  Bathing, Feeding, Dressing Bathing Assistance: Maximum assistance Feeding assistance: Limited assistance Dressing Assistance: Maximum assistance     Functional Limitations Info  Sight, Hearing, Speech Sight Info: Adequate Hearing Info: Adequate Speech Info: Adequate    SPECIAL CARE FACTORS FREQUENCY  PT (By licensed PT), OT (By licensed OT)     PT Frequency: 3x week OT Frequency: 3x week            Contractures Contractures Info: Not present    Additional Factors Info  Code Status, Allergies Code Status Info: DNR Allergies Info: Lisinopril           Current Medications (08/22/2016):  This is the current hospital active medication list Current Facility-Administered Medications  Medication Dose Route Frequency Provider Last Rate Last Dose  . acetaminophen (TYLENOL) tablet 650 mg  650 mg Oral Q6H PRN Marshia LyJames Bethune, PA-C       Or  . acetaminophen (TYLENOL) suppository 650 mg  650 mg Rectal Q6H PRN Marshia LyJames Bethune, PA-C      . alum & mag hydroxide-simeth (MAALOX/MYLANTA) 200-200-20 MG/5ML suspension 30 mL  30 mL Oral Q4H PRN Marshia LyJames Bethune, PA-C      . aspirin EC tablet 325 mg  325 mg Oral Q breakfast Marshia LyJames Bethune, PA-C   325 mg at 08/22/16 0759  . cholecalciferol (VITAMIN D) tablet 5,000 Units  5,000 Units Oral Daily Lorretta HarpXilin Niu, MD   5,000 Units at 08/21/16 1017  . feeding supplement (ENSURE ENLIVE) (ENSURE ENLIVE) liquid 237 mL  237 mL Oral TID BM Ripudeep K Rai,  MD   237 mL at 08/21/16 1414  . ferrous sulfate tablet 325 mg  325 mg Oral BID WC Marshia Ly, PA-C   325 mg at 08/22/16 0759  . hydrALAZINE (APRESOLINE) injection 5 mg  5 mg Intravenous Q2H PRN Lorretta Harp, MD      . hydrochlorothiazide (HYDRODIURIL) tablet 25 mg  25 mg Oral Daily Lorretta Harp, MD   25 mg at 08/21/16 1018  . HYDROcodone-acetaminophen (NORCO/VICODIN) 5-325 MG per tablet 1 tablet  1 tablet Oral Q6H PRN Marshia Ly, PA-C      . levothyroxine (SYNTHROID, LEVOTHROID) tablet 50 mcg  50  mcg Oral QAC breakfast Lorretta Harp, MD   50 mcg at 08/22/16 0759  . methocarbamol (ROBAXIN) tablet 500 mg  500 mg Oral Q8H PRN Lorretta Harp, MD   500 mg at 08/21/16 1414  . morphine 4 MG/ML injection 1 mg  1 mg Intravenous Q3H PRN Lorretta Harp, MD   1 mg at 08/20/16 0547  . mupirocin ointment (BACTROBAN) 2 % 1 application  1 application Topical QID Lorretta Harp, MD   1 application at 08/21/16 2132  . ondansetron (ZOFRAN) tablet 4 mg  4 mg Oral Q6H PRN Marshia Ly, PA-C       Or  . ondansetron [facility]) injection 4 mg  4 mg Intravenous Q6H PRN Marshia Ly, PA-C      . polyethylene glycol (MIRALAX / GLYCOLAX) packet 17 g  17 g Oral Daily PRN Lorretta Harp, MD      . potassium chloride SA (K-DUR,KLOR-CON) CR tablet 40 mEq  40 mEq Oral Once Richarda Overlie, MD      . promethazine (PHENERGAN) injection 6.25-12.5 mg  6.25-12.5 mg Intravenous Q15 min PRN Lowella Curb, MD      . rivastigmine (EXELON) 9.5 mg/24hr 9.5 mg  9.5 mg Transdermal Daily Lorretta Harp, MD   9.5 mg at 08/21/16 1000  . zolpidem (AMBIEN) tablet 5 mg  5 mg Oral QHS PRN Lorretta Harp, MD         Discharge Medications: Please see discharge summary for a list of discharge medications.  Relevant Imaging Results:  Relevant Lab Results:   Additional Information SSN: [id number]  Maree Krabbe, LCSW

## 2016-08-22 NOTE — Progress Notes (Signed)
Report called and given to RN Missy at [facility].  D/c to snf now via ambulance. VSS pt states pain has improved. dsg and incisions unchanged

## 2016-08-22 NOTE — Clinical Social Work Note (Signed)
Clinical Social Worker facilitated patient discharge including contacting patient family and facility to confirm patient discharge plans.  Clinical information faxed to facility and family agreeable with plan.  CSW arranged ambulance transport via [facility] (4:00) to [facility].  RN to call [phone] for report prior to discharge. Patient going to room 603B.  Clinical Social Worker will sign off for now as social work intervention is no longer needed. Please consult [place] again if new need arises.  [address]Bridget Mayton, [place] [phone]

## 2016-08-22 NOTE — Progress Notes (Signed)
Left voicemail with second social worker on call re snf placement

## 2016-08-22 NOTE — Progress Notes (Signed)
Lm vm with [phone] social work for update of d/c snf

## 2016-08-23 LAB — URINE CULTURE: CULTURE: NO GROWTH

## 2016-08-24 DIAGNOSIS — G301 Alzheimer's disease with late onset: Secondary | ICD-10-CM | POA: Diagnosis not present

## 2016-08-24 DIAGNOSIS — I10 Essential (primary) hypertension: Secondary | ICD-10-CM | POA: Diagnosis not present

## 2016-08-24 DIAGNOSIS — S72001A Fracture of unspecified part of neck of right femur, initial encounter for closed fracture: Secondary | ICD-10-CM | POA: Diagnosis not present

## 2016-08-24 DIAGNOSIS — D649 Anemia, unspecified: Secondary | ICD-10-CM | POA: Diagnosis not present

## 2016-08-24 DIAGNOSIS — E46 Unspecified protein-calorie malnutrition: Secondary | ICD-10-CM | POA: Diagnosis not present

## 2016-09-10 DIAGNOSIS — S72141D Displaced intertrochanteric fracture of right femur, subsequent encounter for closed fracture with routine healing: Secondary | ICD-10-CM | POA: Diagnosis not present

## 2016-09-24 DIAGNOSIS — N390 Urinary tract infection, site not specified: Secondary | ICD-10-CM | POA: Diagnosis not present

## 2016-09-24 DIAGNOSIS — G301 Alzheimer's disease with late onset: Secondary | ICD-10-CM | POA: Diagnosis not present

## 2016-09-24 DIAGNOSIS — D649 Anemia, unspecified: Secondary | ICD-10-CM | POA: Diagnosis not present

## 2016-10-07 ENCOUNTER — Telehealth: Payer: Self-pay | Admitting: Family Medicine

## 2016-10-07 NOTE — Telephone Encounter (Signed)
THIS MESSAGE IS FROM YVETTE AT [facility] CARE FOR DR. SHAW: THE PATIENT WILL BE DISCHARGED FROM REHAB. ON Friday 10/09/16 AND RETURN HOME TO PICK UP HOSPICE. SHE WOULD LIKE TO KNOW IF DR. SHAW WILL AGREE AGAIN TO BE HER ATTENDING PHYSICIAN? BEST PHONE [phone] (YVETTE AT [facility]) MBC

## 2016-10-08 DIAGNOSIS — S72141D Displaced intertrochanteric fracture of right femur, subsequent encounter for closed fracture with routine healing: Secondary | ICD-10-CM | POA: Diagnosis not present

## 2016-10-08 NOTE — Telephone Encounter (Signed)
yevette advised

## 2016-10-08 NOTE — Telephone Encounter (Signed)
Yep, that's fine.

## 2016-10-16 ENCOUNTER — Other Ambulatory Visit: Payer: Self-pay | Admitting: Family Medicine

## 2016-10-20 ENCOUNTER — Telehealth: Payer: Self-pay | Admitting: Family Medicine

## 2016-10-20 DIAGNOSIS — D638 Anemia in other chronic diseases classified elsewhere: Secondary | ICD-10-CM

## 2016-10-20 DIAGNOSIS — Z5181 Encounter for therapeutic drug level monitoring: Secondary | ICD-10-CM

## 2016-10-20 DIAGNOSIS — R600 Localized edema: Secondary | ICD-10-CM

## 2016-10-20 DIAGNOSIS — E876 Hypokalemia: Secondary | ICD-10-CM

## 2016-10-20 NOTE — Telephone Encounter (Signed)
Patient's daughter called Re: patient's blood pressure medicine.  When patient was in the hospital they D/C the medication, however, now that they are home and hospice nurse has been coming out blood pressures are running high again.  Top numbers have been between 140-169.  Daughter is not sure about bottom numbers.  Daughter stated that her mother "had some blood pressure medication at home" so she has been giving it to her again.  Patient is almost out of bp meds and daughter states that she needs it as soon as possible.   Hospice nurse is named Archie Pattenonya and daughter, Charlott HollerJudy Strawn, states that she can verify patient's bp readings if needed.   [name]'s numbers are [phone] cell and [phone] home. Please advise.

## 2016-10-20 NOTE — Telephone Encounter (Addendum)
Discussed with Hailey Taylor that pts BP was 160/ in Rt  Arm and 140/ in Lt arm initially when got back home after SNF rehab for hip frx.  The 20 mmHg differential between the 2 arms SBP with right arm significantly higher is at baseline for patient for many years. Hailey Taylor had a few hctz left over so Hailey Taylor restarted them and at last check was 150 in Rt and 120 in Lt arm and she is having increased diuresis and swelling in the prev fractured leg is decreasing nicely. Archie Pattenonya, hopsice nurse, is checking her BP wkly   Discussed with Hailey Taylor the risks of restart HCTZ as to risks of hyponatremia, hypokalemia, dehydration, acute renal failure leading to dizziness, increased fall risk, muscle weakness, even seizure/coma - risks from the unmonitored HCTZ are so much higher than the risks of elevated blood pressure in this patient with severely progressed dementia. We control blood pressure so that over many years and decades it does not cause wear and tear on the brain heart and kidneys but at this point in any 83 year old we are no longer concerned about what is happening many years down the line and want to do everything we can to avoid the day today risks of falls. Goal  BP in 140-150.  Hailey Taylor reports Hailey Taylor is trying to get up and transfer more which has caused more stress on [name]'s back but now that the swelling is going down in her leg with HCTZ she seems to be moving better and be in less pain. We agreed that Hailey Taylor will give Hailey Taylor the HCTZ when necessary for lower extremity edema. Ok to continue to check BP weekly but reluctant to treat BP. Check electrolytes and hemoglobin in 1 week as last done 2 months ago at hospital discharge were still significantly abnormal. Will ask hospice nurse to draw these on our behalf.

## 2016-10-20 NOTE — Telephone Encounter (Signed)
Important part of below - please call hospice to ask them to draw labs on pt next wk. hospice nurseTonya normally does home visit once a week per pt's daughter Darel HongJudy. I would like her to draw labs on patient when she goes out next week. Future labs entered under Future and "lab collect" for CBC, CMP, magnesium.

## 2016-10-21 NOTE — Telephone Encounter (Signed)
Left message to have Archie Pattenonya, home nurse return call

## 2016-10-26 ENCOUNTER — Telehealth: Payer: Self-pay | Admitting: *Deleted

## 2016-10-26 NOTE — Telephone Encounter (Signed)
[facility]osp[facility] Nurse) concerning patient's lab orders CBC, CMP, urine and magnesium.  I spoke to Dr Clelia CroftShaw and the patient's daughter thought this office was doing the lab here. [facility] for CBC, CMP, magnesium, and u/a, per Dr Clelia CroftShaw.

## 2016-11-06 ENCOUNTER — Telehealth: Payer: Self-pay | Admitting: Family Medicine

## 2016-11-06 ENCOUNTER — Encounter: Payer: Self-pay | Admitting: Family Medicine

## 2016-11-06 NOTE — Telephone Encounter (Signed)
[name] STRAWN AND HOSPICE NURSE TONYA [phone] CALLING TO CHECK TO SEE IF DR SHAW HAS LOOKED AT LABS THAT TONYA FAXED OVER ON MAY [id number] PLEASE CALL

## 2016-11-08 ENCOUNTER — Other Ambulatory Visit: Payer: Self-pay | Admitting: Family Medicine

## 2016-11-08 DIAGNOSIS — E034 Atrophy of thyroid (acquired): Secondary | ICD-10-CM

## 2016-11-09 NOTE — Telephone Encounter (Signed)
Yes, I finally received them.  She was very dehydrated, malnourished, and might possible have a UTI but it was far to late to get a culture.  Please collect a second urine sample and send it for culture. Please let me know if pt is symptomatic at all for UTI (e.g. worse balance, more gait disturbance, more incontinence, urine odiferous or discolored.),  THanks

## 2016-11-10 ENCOUNTER — Encounter: Payer: Self-pay | Admitting: Family Medicine

## 2016-11-10 NOTE — Telephone Encounter (Signed)
Some urine retention so nurse placed catheter, she will collect sample today.

## 2016-11-10 NOTE — Telephone Encounter (Signed)
L/m with shaws note and call back

## 2016-11-11 NOTE — Telephone Encounter (Signed)
See phone call with same note.

## 2016-11-11 NOTE — Telephone Encounter (Signed)
Perfect, thanks 

## 2016-11-12 ENCOUNTER — Encounter: Payer: Self-pay | Admitting: Family Medicine

## 2016-11-13 ENCOUNTER — Other Ambulatory Visit: Payer: Self-pay | Admitting: Family Medicine

## 2016-11-13 ENCOUNTER — Telehealth: Payer: Self-pay

## 2016-11-13 MED ORDER — CEPHALEXIN 500 MG PO CAPS: 500.0000 mg | ORAL_CAPSULE | Freq: Two times a day (BID) | ORAL | 0 refills | Status: DC

## 2016-11-13 NOTE — Telephone Encounter (Signed)
[phone] [facility] looking for urine results. Danella DeisShay is getting from lab

## 2016-11-13 NOTE — Telephone Encounter (Signed)
Urine clx shows 3+ strains of >10K bacteria. Pt is having worsening agression, imbalance, and is continuing to have urinary retention requiring repeat catheter placement so I advised Clydie BraunKaren who advised hospice RN to recollect UA, Umicro, and UClx and start keflex 500mg  bid x 7d immed after urine collection.

## 2016-11-13 NOTE — Telephone Encounter (Signed)
[phone] [facility] advised spec contamination  Still has urinary retention when cath out yesterday, back in now   Confusion, agitation continues.   will recollect urine for CS (quest)  And start keflex 500mg  bid x 7 days

## 2016-11-16 ENCOUNTER — Encounter: Payer: Self-pay | Admitting: Family Medicine

## 2016-11-16 NOTE — Telephone Encounter (Signed)
Please call rx for exelon patch into the [facility] (in [place]) - [phone] so they can ship to pt.

## 2016-11-17 MED ORDER — RIVASTIGMINE 9.5 MG/24HR TD PT24: MEDICATED_PATCH | TRANSDERMAL | 5 refills | Status: DC

## 2016-11-17 NOTE — Telephone Encounter (Signed)
Called to [place] pharm listed, l/m

## 2016-11-30 ENCOUNTER — Telehealth: Payer: Self-pay | Admitting: Family Medicine

## 2016-11-30 NOTE — Telephone Encounter (Signed)
Hailey Taylor with hospice is calling to let Dr. Clelia CroftShaw know that the patient is being transferred to [facility] for five night rested stay

## 2016-11-30 NOTE — Telephone Encounter (Signed)
FYI

## 2016-11-30 NOTE — Telephone Encounter (Signed)
Wonderful thanks!

## 2016-12-25 DIAGNOSIS — Z7409 Other reduced mobility: Secondary | ICD-10-CM | POA: Diagnosis not present

## 2016-12-25 DIAGNOSIS — E039 Hypothyroidism, unspecified: Secondary | ICD-10-CM | POA: Diagnosis not present

## 2016-12-25 DIAGNOSIS — F419 Anxiety disorder, unspecified: Secondary | ICD-10-CM | POA: Diagnosis not present

## 2016-12-25 DIAGNOSIS — F0390 Unspecified dementia without behavioral disturbance: Secondary | ICD-10-CM | POA: Diagnosis not present

## 2017-01-02 DIAGNOSIS — R2689 Other abnormalities of gait and mobility: Secondary | ICD-10-CM | POA: Diagnosis not present

## 2017-01-11 DIAGNOSIS — L89610 Pressure ulcer of right heel, unstageable: Secondary | ICD-10-CM | POA: Diagnosis not present

## 2017-01-13 DIAGNOSIS — G309 Alzheimer's disease, unspecified: Secondary | ICD-10-CM | POA: Diagnosis not present

## 2017-01-13 DIAGNOSIS — I10 Essential (primary) hypertension: Secondary | ICD-10-CM | POA: Diagnosis not present

## 2017-01-13 DIAGNOSIS — E039 Hypothyroidism, unspecified: Secondary | ICD-10-CM | POA: Diagnosis not present

## 2017-01-14 DIAGNOSIS — I10 Essential (primary) hypertension: Secondary | ICD-10-CM | POA: Diagnosis not present

## 2017-01-14 DIAGNOSIS — E039 Hypothyroidism, unspecified: Secondary | ICD-10-CM | POA: Diagnosis not present

## 2017-01-14 DIAGNOSIS — G309 Alzheimer's disease, unspecified: Secondary | ICD-10-CM | POA: Diagnosis not present

## 2017-01-19 DIAGNOSIS — E039 Hypothyroidism, unspecified: Secondary | ICD-10-CM | POA: Diagnosis not present

## 2017-01-19 DIAGNOSIS — I10 Essential (primary) hypertension: Secondary | ICD-10-CM | POA: Diagnosis not present

## 2017-01-19 DIAGNOSIS — G309 Alzheimer's disease, unspecified: Secondary | ICD-10-CM | POA: Diagnosis not present

## 2017-01-21 DIAGNOSIS — E039 Hypothyroidism, unspecified: Secondary | ICD-10-CM | POA: Diagnosis not present

## 2017-01-21 DIAGNOSIS — I10 Essential (primary) hypertension: Secondary | ICD-10-CM | POA: Diagnosis not present

## 2017-01-21 DIAGNOSIS — G309 Alzheimer's disease, unspecified: Secondary | ICD-10-CM | POA: Diagnosis not present

## 2017-01-25 DIAGNOSIS — I70235 Atherosclerosis of native arteries of right leg with ulceration of other part of foot: Secondary | ICD-10-CM | POA: Diagnosis not present

## 2017-01-25 DIAGNOSIS — L89610 Pressure ulcer of right heel, unstageable: Secondary | ICD-10-CM | POA: Diagnosis not present

## 2017-01-25 DIAGNOSIS — R2689 Other abnormalities of gait and mobility: Secondary | ICD-10-CM | POA: Diagnosis not present

## 2017-01-26 DIAGNOSIS — I10 Essential (primary) hypertension: Secondary | ICD-10-CM | POA: Diagnosis not present

## 2017-01-26 DIAGNOSIS — E039 Hypothyroidism, unspecified: Secondary | ICD-10-CM | POA: Diagnosis not present

## 2017-01-26 DIAGNOSIS — G309 Alzheimer's disease, unspecified: Secondary | ICD-10-CM | POA: Diagnosis not present

## 2017-01-28 DIAGNOSIS — E039 Hypothyroidism, unspecified: Secondary | ICD-10-CM | POA: Diagnosis not present

## 2017-01-28 DIAGNOSIS — G309 Alzheimer's disease, unspecified: Secondary | ICD-10-CM | POA: Diagnosis not present

## 2017-01-28 DIAGNOSIS — I10 Essential (primary) hypertension: Secondary | ICD-10-CM | POA: Diagnosis not present

## 2017-02-01 ENCOUNTER — Telehealth: Payer: Self-pay | Admitting: Family Medicine

## 2017-02-01 DIAGNOSIS — I70235 Atherosclerosis of native arteries of right leg with ulceration of other part of foot: Secondary | ICD-10-CM | POA: Diagnosis not present

## 2017-02-01 DIAGNOSIS — L89610 Pressure ulcer of right heel, unstageable: Secondary | ICD-10-CM | POA: Diagnosis not present

## 2017-02-01 NOTE — Telephone Encounter (Signed)
Pt daughter dropped of disability parking placard paperwork for dr Clelia Croft to fill out in an envelope placed in dr Clelia Croft box in [phone] where the phones are!

## 2017-02-02 DIAGNOSIS — I10 Essential (primary) hypertension: Secondary | ICD-10-CM | POA: Diagnosis not present

## 2017-02-02 DIAGNOSIS — E039 Hypothyroidism, unspecified: Secondary | ICD-10-CM | POA: Diagnosis not present

## 2017-02-02 DIAGNOSIS — G309 Alzheimer's disease, unspecified: Secondary | ICD-10-CM | POA: Diagnosis not present

## 2017-02-03 DIAGNOSIS — G309 Alzheimer's disease, unspecified: Secondary | ICD-10-CM | POA: Diagnosis not present

## 2017-02-03 DIAGNOSIS — E039 Hypothyroidism, unspecified: Secondary | ICD-10-CM | POA: Diagnosis not present

## 2017-02-03 DIAGNOSIS — I10 Essential (primary) hypertension: Secondary | ICD-10-CM | POA: Diagnosis not present

## 2017-02-03 NOTE — Addendum Note (Signed)
Addendum  created 02/03/17 1216 by Solan Vosler, MD   Sign clinical note    

## 2017-02-04 DIAGNOSIS — I10 Essential (primary) hypertension: Secondary | ICD-10-CM | POA: Diagnosis not present

## 2017-02-04 DIAGNOSIS — E039 Hypothyroidism, unspecified: Secondary | ICD-10-CM | POA: Diagnosis not present

## 2017-02-04 DIAGNOSIS — G309 Alzheimer's disease, unspecified: Secondary | ICD-10-CM | POA: Diagnosis not present

## 2017-02-05 DIAGNOSIS — E039 Hypothyroidism, unspecified: Secondary | ICD-10-CM | POA: Diagnosis not present

## 2017-02-05 DIAGNOSIS — I10 Essential (primary) hypertension: Secondary | ICD-10-CM | POA: Diagnosis not present

## 2017-02-05 DIAGNOSIS — G309 Alzheimer's disease, unspecified: Secondary | ICD-10-CM | POA: Diagnosis not present

## 2017-02-08 DIAGNOSIS — E039 Hypothyroidism, unspecified: Secondary | ICD-10-CM | POA: Diagnosis not present

## 2017-02-08 DIAGNOSIS — I10 Essential (primary) hypertension: Secondary | ICD-10-CM | POA: Diagnosis not present

## 2017-02-08 DIAGNOSIS — G309 Alzheimer's disease, unspecified: Secondary | ICD-10-CM | POA: Diagnosis not present

## 2017-02-09 DIAGNOSIS — I10 Essential (primary) hypertension: Secondary | ICD-10-CM | POA: Diagnosis not present

## 2017-02-09 DIAGNOSIS — G309 Alzheimer's disease, unspecified: Secondary | ICD-10-CM | POA: Diagnosis not present

## 2017-02-09 DIAGNOSIS — E039 Hypothyroidism, unspecified: Secondary | ICD-10-CM | POA: Diagnosis not present

## 2017-02-11 DIAGNOSIS — G309 Alzheimer's disease, unspecified: Secondary | ICD-10-CM | POA: Diagnosis not present

## 2017-02-11 DIAGNOSIS — I10 Essential (primary) hypertension: Secondary | ICD-10-CM | POA: Diagnosis not present

## 2017-02-11 DIAGNOSIS — E039 Hypothyroidism, unspecified: Secondary | ICD-10-CM | POA: Diagnosis not present

## 2017-02-13 DIAGNOSIS — E039 Hypothyroidism, unspecified: Secondary | ICD-10-CM | POA: Diagnosis not present

## 2017-02-13 DIAGNOSIS — I10 Essential (primary) hypertension: Secondary | ICD-10-CM | POA: Diagnosis not present

## 2017-02-13 DIAGNOSIS — G309 Alzheimer's disease, unspecified: Secondary | ICD-10-CM | POA: Diagnosis not present

## 2017-02-16 DIAGNOSIS — E039 Hypothyroidism, unspecified: Secondary | ICD-10-CM | POA: Diagnosis not present

## 2017-02-16 DIAGNOSIS — G309 Alzheimer's disease, unspecified: Secondary | ICD-10-CM | POA: Diagnosis not present

## 2017-02-16 DIAGNOSIS — I10 Essential (primary) hypertension: Secondary | ICD-10-CM | POA: Diagnosis not present

## 2017-02-17 ENCOUNTER — Telehealth: Payer: Self-pay

## 2017-02-17 NOTE — Telephone Encounter (Signed)
Advised pt daughter the disability placard info was ready to be picked up at [address].

## 2017-02-18 DIAGNOSIS — E039 Hypothyroidism, unspecified: Secondary | ICD-10-CM | POA: Diagnosis not present

## 2017-02-18 DIAGNOSIS — I10 Essential (primary) hypertension: Secondary | ICD-10-CM | POA: Diagnosis not present

## 2017-02-18 DIAGNOSIS — G309 Alzheimer's disease, unspecified: Secondary | ICD-10-CM | POA: Diagnosis not present

## 2017-02-19 DIAGNOSIS — E039 Hypothyroidism, unspecified: Secondary | ICD-10-CM | POA: Diagnosis not present

## 2017-02-19 DIAGNOSIS — G309 Alzheimer's disease, unspecified: Secondary | ICD-10-CM | POA: Diagnosis not present

## 2017-02-19 DIAGNOSIS — I10 Essential (primary) hypertension: Secondary | ICD-10-CM | POA: Diagnosis not present

## 2017-02-22 DIAGNOSIS — E039 Hypothyroidism, unspecified: Secondary | ICD-10-CM | POA: Diagnosis not present

## 2017-02-22 DIAGNOSIS — G309 Alzheimer's disease, unspecified: Secondary | ICD-10-CM | POA: Diagnosis not present

## 2017-02-22 DIAGNOSIS — I10 Essential (primary) hypertension: Secondary | ICD-10-CM | POA: Diagnosis not present

## 2017-02-23 DIAGNOSIS — I10 Essential (primary) hypertension: Secondary | ICD-10-CM | POA: Diagnosis not present

## 2017-02-23 DIAGNOSIS — G309 Alzheimer's disease, unspecified: Secondary | ICD-10-CM | POA: Diagnosis not present

## 2017-02-23 DIAGNOSIS — E039 Hypothyroidism, unspecified: Secondary | ICD-10-CM | POA: Diagnosis not present

## 2017-02-24 DIAGNOSIS — I70235 Atherosclerosis of native arteries of right leg with ulceration of other part of foot: Secondary | ICD-10-CM | POA: Diagnosis not present

## 2017-02-24 DIAGNOSIS — M79642 Pain in left hand: Secondary | ICD-10-CM | POA: Diagnosis not present

## 2017-02-24 DIAGNOSIS — L89610 Pressure ulcer of right heel, unstageable: Secondary | ICD-10-CM | POA: Diagnosis not present

## 2017-02-25 DIAGNOSIS — G309 Alzheimer's disease, unspecified: Secondary | ICD-10-CM | POA: Diagnosis not present

## 2017-02-25 DIAGNOSIS — I10 Essential (primary) hypertension: Secondary | ICD-10-CM | POA: Diagnosis not present

## 2017-02-25 DIAGNOSIS — E039 Hypothyroidism, unspecified: Secondary | ICD-10-CM | POA: Diagnosis not present

## 2017-03-01 DIAGNOSIS — L89613 Pressure ulcer of right heel, stage 3: Secondary | ICD-10-CM | POA: Diagnosis not present

## 2017-03-01 DIAGNOSIS — I70235 Atherosclerosis of native arteries of right leg with ulceration of other part of foot: Secondary | ICD-10-CM | POA: Diagnosis not present

## 2017-03-02 DIAGNOSIS — G309 Alzheimer's disease, unspecified: Secondary | ICD-10-CM | POA: Diagnosis not present

## 2017-03-02 DIAGNOSIS — E039 Hypothyroidism, unspecified: Secondary | ICD-10-CM | POA: Diagnosis not present

## 2017-03-02 DIAGNOSIS — I10 Essential (primary) hypertension: Secondary | ICD-10-CM | POA: Diagnosis not present

## 2017-03-04 DIAGNOSIS — F419 Anxiety disorder, unspecified: Secondary | ICD-10-CM | POA: Diagnosis not present

## 2017-03-04 DIAGNOSIS — G301 Alzheimer's disease with late onset: Secondary | ICD-10-CM | POA: Diagnosis not present

## 2017-03-04 DIAGNOSIS — E039 Hypothyroidism, unspecified: Secondary | ICD-10-CM | POA: Diagnosis not present

## 2017-03-04 DIAGNOSIS — G309 Alzheimer's disease, unspecified: Secondary | ICD-10-CM | POA: Diagnosis not present

## 2017-03-04 DIAGNOSIS — I10 Essential (primary) hypertension: Secondary | ICD-10-CM | POA: Diagnosis not present

## 2017-03-04 DIAGNOSIS — F0280 Dementia in other diseases classified elsewhere without behavioral disturbance: Secondary | ICD-10-CM | POA: Diagnosis not present

## 2017-03-09 DIAGNOSIS — I10 Essential (primary) hypertension: Secondary | ICD-10-CM | POA: Diagnosis not present

## 2017-03-09 DIAGNOSIS — G309 Alzheimer's disease, unspecified: Secondary | ICD-10-CM | POA: Diagnosis not present

## 2017-03-09 DIAGNOSIS — E039 Hypothyroidism, unspecified: Secondary | ICD-10-CM | POA: Diagnosis not present

## 2017-03-11 DIAGNOSIS — I10 Essential (primary) hypertension: Secondary | ICD-10-CM | POA: Diagnosis not present

## 2017-03-11 DIAGNOSIS — E039 Hypothyroidism, unspecified: Secondary | ICD-10-CM | POA: Diagnosis not present

## 2017-03-11 DIAGNOSIS — G309 Alzheimer's disease, unspecified: Secondary | ICD-10-CM | POA: Diagnosis not present

## 2017-03-15 DIAGNOSIS — G309 Alzheimer's disease, unspecified: Secondary | ICD-10-CM | POA: Diagnosis not present

## 2017-03-15 DIAGNOSIS — E039 Hypothyroidism, unspecified: Secondary | ICD-10-CM | POA: Diagnosis not present

## 2017-03-15 DIAGNOSIS — I70235 Atherosclerosis of native arteries of right leg with ulceration of other part of foot: Secondary | ICD-10-CM | POA: Diagnosis not present

## 2017-03-15 DIAGNOSIS — L89613 Pressure ulcer of right heel, stage 3: Secondary | ICD-10-CM | POA: Diagnosis not present

## 2017-03-15 DIAGNOSIS — I10 Essential (primary) hypertension: Secondary | ICD-10-CM | POA: Diagnosis not present

## 2017-03-22 DIAGNOSIS — L89613 Pressure ulcer of right heel, stage 3: Secondary | ICD-10-CM | POA: Diagnosis not present

## 2017-03-22 DIAGNOSIS — I70235 Atherosclerosis of native arteries of right leg with ulceration of other part of foot: Secondary | ICD-10-CM | POA: Diagnosis not present

## 2017-03-23 ENCOUNTER — Encounter: Payer: Self-pay | Admitting: Family Medicine

## 2017-03-29 DIAGNOSIS — L89613 Pressure ulcer of right heel, stage 3: Secondary | ICD-10-CM | POA: Diagnosis not present

## 2017-03-29 DIAGNOSIS — I70235 Atherosclerosis of native arteries of right leg with ulceration of other part of foot: Secondary | ICD-10-CM | POA: Diagnosis not present

## 2017-04-05 DIAGNOSIS — I70235 Atherosclerosis of native arteries of right leg with ulceration of other part of foot: Secondary | ICD-10-CM | POA: Diagnosis not present

## 2017-04-05 DIAGNOSIS — L89613 Pressure ulcer of right heel, stage 3: Secondary | ICD-10-CM | POA: Diagnosis not present

## 2017-04-09 ENCOUNTER — Inpatient Hospital Stay (HOSPITAL_COMMUNITY)
Admission: EM | Admit: 2017-04-09 | Discharge: 2017-04-11 | DRG: 604 | Disposition: A | Discharge status: Skilled Nursing Facility | Attending: Internal Medicine | Admitting: Internal Medicine

## 2017-04-09 ENCOUNTER — Emergency Department (HOSPITAL_COMMUNITY)

## 2017-04-09 ENCOUNTER — Encounter (HOSPITAL_COMMUNITY): Payer: Self-pay | Admitting: *Deleted

## 2017-04-09 DIAGNOSIS — S0181XA Laceration without foreign body of other part of head, initial encounter: Secondary | ICD-10-CM

## 2017-04-09 DIAGNOSIS — Z66 Do not resuscitate: Secondary | ICD-10-CM | POA: Diagnosis present

## 2017-04-09 DIAGNOSIS — D72829 Elevated white blood cell count, unspecified: Secondary | ICD-10-CM | POA: Diagnosis present

## 2017-04-09 DIAGNOSIS — Z96649 Presence of unspecified artificial hip joint: Secondary | ICD-10-CM | POA: Diagnosis present

## 2017-04-09 DIAGNOSIS — S0180XA Unspecified open wound of other part of head, initial encounter: Secondary | ICD-10-CM | POA: Diagnosis not present

## 2017-04-09 DIAGNOSIS — W050XXA Fall from non-moving wheelchair, initial encounter: Secondary | ICD-10-CM | POA: Diagnosis present

## 2017-04-09 DIAGNOSIS — E43 Unspecified severe protein-calorie malnutrition: Secondary | ICD-10-CM | POA: Diagnosis present

## 2017-04-09 DIAGNOSIS — W19XXXA Unspecified fall, initial encounter: Secondary | ICD-10-CM | POA: Diagnosis not present

## 2017-04-09 DIAGNOSIS — D62 Acute posthemorrhagic anemia: Secondary | ICD-10-CM | POA: Diagnosis present

## 2017-04-09 DIAGNOSIS — N179 Acute kidney failure, unspecified: Secondary | ICD-10-CM | POA: Diagnosis present

## 2017-04-09 DIAGNOSIS — S8002XA Contusion of left knee, initial encounter: Secondary | ICD-10-CM | POA: Diagnosis not present

## 2017-04-09 DIAGNOSIS — S0990XA Unspecified injury of head, initial encounter: Secondary | ICD-10-CM | POA: Diagnosis not present

## 2017-04-09 DIAGNOSIS — R0602 Shortness of breath: Secondary | ICD-10-CM

## 2017-04-09 DIAGNOSIS — S199XXA Unspecified injury of neck, initial encounter: Secondary | ICD-10-CM | POA: Diagnosis not present

## 2017-04-09 DIAGNOSIS — Z681 Body mass index (BMI) 19 or less, adult: Secondary | ICD-10-CM

## 2017-04-09 DIAGNOSIS — N183 Chronic kidney disease, stage 3 (moderate): Secondary | ICD-10-CM | POA: Diagnosis present

## 2017-04-09 DIAGNOSIS — D649 Anemia, unspecified: Secondary | ICD-10-CM | POA: Diagnosis present

## 2017-04-09 DIAGNOSIS — R413 Other amnesia: Secondary | ICD-10-CM | POA: Diagnosis present

## 2017-04-09 DIAGNOSIS — Z7982 Long term (current) use of aspirin: Secondary | ICD-10-CM

## 2017-04-09 DIAGNOSIS — Y92129 Unspecified place in nursing home as the place of occurrence of the external cause: Secondary | ICD-10-CM

## 2017-04-09 DIAGNOSIS — G309 Alzheimer's disease, unspecified: Secondary | ICD-10-CM | POA: Diagnosis present

## 2017-04-09 DIAGNOSIS — L8990 Pressure ulcer of unspecified site, unspecified stage: Secondary | ICD-10-CM | POA: Insufficient documentation

## 2017-04-09 DIAGNOSIS — S0101XA Laceration without foreign body of scalp, initial encounter: Principal | ICD-10-CM | POA: Diagnosis present

## 2017-04-09 DIAGNOSIS — S0191XA Laceration without foreign body of unspecified part of head, initial encounter: Secondary | ICD-10-CM | POA: Diagnosis present

## 2017-04-09 DIAGNOSIS — F0280 Dementia in other diseases classified elsewhere without behavioral disturbance: Secondary | ICD-10-CM | POA: Diagnosis present

## 2017-04-09 DIAGNOSIS — D638 Anemia in other chronic diseases classified elsewhere: Secondary | ICD-10-CM | POA: Diagnosis present

## 2017-04-09 DIAGNOSIS — Z888 Allergy status to other drugs, medicaments and biological substances status: Secondary | ICD-10-CM

## 2017-04-09 DIAGNOSIS — S3993XA Unspecified injury of pelvis, initial encounter: Secondary | ICD-10-CM | POA: Diagnosis not present

## 2017-04-09 DIAGNOSIS — I129 Hypertensive chronic kidney disease with stage 1 through stage 4 chronic kidney disease, or unspecified chronic kidney disease: Secondary | ICD-10-CM | POA: Diagnosis present

## 2017-04-09 DIAGNOSIS — Z79899 Other long term (current) drug therapy: Secondary | ICD-10-CM

## 2017-04-09 DIAGNOSIS — E039 Hypothyroidism, unspecified: Secondary | ICD-10-CM | POA: Diagnosis present

## 2017-04-09 DIAGNOSIS — Z87891 Personal history of nicotine dependence: Secondary | ICD-10-CM

## 2017-04-09 DIAGNOSIS — I10 Essential (primary) hypertension: Secondary | ICD-10-CM | POA: Diagnosis present

## 2017-04-09 LAB — BASIC METABOLIC PANEL
Anion gap: 12 (ref 5–15)
BUN: 31 mg/dL — ABNORMAL HIGH (ref 6–20)
CO2: 23 mmol/L (ref 22–32)
Calcium: 9 mg/dL (ref 8.9–10.3)
Chloride: 107 mmol/L (ref 101–111)
Creatinine, Ser: 1.07 mg/dL — ABNORMAL HIGH (ref 0.44–1.00)
GFR calc Af Amer: 50 mL/min — ABNORMAL LOW (ref >60)
GFR calc non Af Amer: 43 mL/min — ABNORMAL LOW (ref >60)
Glucose, Bld: 159 mg/dL — ABNORMAL HIGH (ref 65–99)
Potassium: 4.7 mmol/L (ref 3.5–5.1)
Sodium: 142 mmol/L (ref 135–145)

## 2017-04-09 LAB — CBC WITH DIFFERENTIAL/PLATELET
Basophils Absolute: 0 10*3/uL (ref 0.0–0.1)
Basophils Relative: 0 %
Eosinophils Absolute: 0 10*3/uL (ref 0.0–0.7)
Eosinophils Relative: 0 %
HCT: 26.4 % — ABNORMAL LOW (ref 36.0–46.0)
Hemoglobin: 8.7 g/dL — ABNORMAL LOW (ref 12.0–15.0)
Lymphocytes Relative: 4 %
Lymphs Abs: 0.8 10*3/uL (ref 0.7–4.0)
MCH: 32.1 pg (ref 26.0–34.0)
MCHC: 33 g/dL (ref 30.0–36.0)
MCV: 97.4 fL (ref 78.0–100.0)
Monocytes Absolute: 1 10*3/uL (ref 0.1–1.0)
Monocytes Relative: 5 %
Neutro Abs: 18 10*3/uL — ABNORMAL HIGH (ref 1.7–7.7)
Neutrophils Relative %: 91 %
Platelets: 245 10*3/uL (ref 150–400)
RBC: 2.71 MIL/uL — ABNORMAL LOW (ref 3.87–5.11)
RDW: 14.8 % (ref 11.5–15.5)
WBC: 19.8 10*3/uL — ABNORMAL HIGH (ref 4.0–10.5)

## 2017-04-09 MED ORDER — LORAZEPAM 2 MG/ML IJ SOLN
0.5000 mg | Freq: Once | INTRAMUSCULAR | Status: AC
  Administered 2017-04-09: 0.5 mg via INTRAMUSCULAR
  Filled 2017-04-09: qty 1

## 2017-04-09 MED ORDER — FERROUS SULFATE 325 (65 FE) MG PO TABS
325.0000 mg | ORAL_TABLET | Freq: Two times a day (BID) | ORAL | Status: DC
  Administered 2017-04-10 – 2017-04-11 (×3): 325 mg via ORAL
  Filled 2017-04-09 (×3): qty 1

## 2017-04-09 MED ORDER — CEFAZOLIN SODIUM-DEXTROSE 1-4 GM/50ML-% IV SOLN
1.0000 g | Freq: Once | INTRAVENOUS | Status: AC
  Administered 2017-04-09: 1 g via INTRAVENOUS
  Filled 2017-04-09: qty 50

## 2017-04-09 MED ORDER — LORAZEPAM 0.5 MG PO TABS
0.5000 mg | ORAL_TABLET | ORAL | Status: DC | PRN
  Administered 2017-04-10: 0.5 mg via ORAL
  Filled 2017-04-09: qty 1

## 2017-04-09 MED ORDER — ACETAMINOPHEN 160 MG/5ML PO SOLN
500.0000 mg | Freq: Once | ORAL | Status: AC
  Administered 2017-04-09: 500 mg via ORAL
  Filled 2017-04-09: qty 20

## 2017-04-09 MED ORDER — POLYETHYLENE GLYCOL 3350 17 G PO PACK: 17.0000 g | PACK | Freq: Every day | ORAL | Status: DC | PRN

## 2017-04-09 MED ORDER — ONDANSETRON HCL 4 MG/2ML IJ SOLN: 4.0000 mg | Freq: Four times a day (QID) | INTRAMUSCULAR | Status: DC | PRN

## 2017-04-09 MED ORDER — LEVOTHYROXINE SODIUM 50 MCG PO TABS
50.0000 ug | ORAL_TABLET | Freq: Every day | ORAL | Status: DC
  Administered 2017-04-10 – 2017-04-11 (×2): 50 ug via ORAL
  Filled 2017-04-09 (×2): qty 1

## 2017-04-09 MED ORDER — ENSURE ENLIVE PO LIQD
237.0000 mL | Freq: Three times a day (TID) | ORAL | Status: DC
  Administered 2017-04-10 (×2): 237 mL via ORAL

## 2017-04-09 MED ORDER — LIDOCAINE-EPINEPHRINE (PF) 2 %-1:200000 IJ SOLN
20.0000 mL | Freq: Once | INTRAMUSCULAR | Status: AC
  Administered 2017-04-09: 20 mL via INTRADERMAL
  Filled 2017-04-09: qty 20

## 2017-04-09 MED ORDER — ONDANSETRON HCL 4 MG PO TABS: 4.0000 mg | ORAL_TABLET | Freq: Four times a day (QID) | ORAL | Status: DC | PRN

## 2017-04-09 MED ORDER — MORPHINE SULFATE (PF) 2 MG/ML IV SOLN: 2.0000 mg | Freq: Once | INTRAVENOUS | Status: DC

## 2017-04-09 MED ORDER — SODIUM CHLORIDE 0.9 % IV SOLN
INTRAVENOUS | Status: DC
  Administered 2017-04-09: via INTRAVENOUS

## 2017-04-09 MED ORDER — RIVASTIGMINE 9.5 MG/24HR TD PT24
9.5000 mg | MEDICATED_PATCH | Freq: Every day | TRANSDERMAL | Status: DC
  Administered 2017-04-10 – 2017-04-11 (×2): 9.5 mg via TRANSDERMAL
  Filled 2017-04-09 (×2): qty 1

## 2017-04-09 MED ORDER — SODIUM CHLORIDE 0.9 % IV SOLN: INTRAVENOUS | Status: DC

## 2017-04-09 MED ORDER — HYDROCODONE-ACETAMINOPHEN 5-325 MG PO TABS
1.0000 | ORAL_TABLET | ORAL | Status: DC | PRN
  Administered 2017-04-09 – 2017-04-11 (×2): 1 via ORAL
  Filled 2017-04-09 (×2): qty 1

## 2017-04-09 NOTE — ED Provider Notes (Signed)
[facility]-EMERGENCY DEPT Provider Note   CSN: [id number] Arrival date & time: 04/09/17  1201     History   Chief Complaint Chief Complaint  Patient presents with  . Fall    HPI Hailey Taylor is a 83 y.o. female.  Level V Caveat: Dementia.   Hailey Taylor is a 83 y.o. Female who is coming from skilled nursing facility asked in place by EMS after witnessed fall during a transfer attempt from her wheelchair.  They were trying to transfer her from her wheelchair to bed when she fell hitting her head and sustaining a laceration to the left side of the front of her head.  The head was wrapped prior to arrival.  Patient has a history of dementia and nursing staff reports she is mentating at her baseline mental status.  She has not had any recent illness.  She is not on anticoagulants.   The history is provided by the nursing home and medical records. No language interpreter was used.  Fall     Past Medical History:  Diagnosis Date  . Hypertension   . Thyroid disease     Patient Active Problem List   Diagnosis Date Noted  . Acute blood loss anemia 08/21/2016  . Protein-calorie malnutrition, severe 08/19/2016  . Displaced intertrochanteric fracture of right femur, initial encounter for closed fracture (HCC) 08/18/2016  . Fall 08/18/2016  . Normocytic anemia 08/18/2016  . Hypokalemia 08/18/2016  . Leukocytosis 08/18/2016  . 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 Dehydration 11/25/2015  . Urinary incontinence without sensory awareness 11/25/2015  . Late onset Alzheimer's disease without behavioral disturbance 07/10/2015  . 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 Hypertension 10/08/2011  . Memory loss 10/08/2011  . Hypothyroidism 10/08/2011  . History of fracture of hip 10/18/2010    Past Surgical History:  Procedure Laterality Date  . FEMUR IM NAIL Right 08/19/2016   Procedure: INTRAMEDULLARY (IM) NAIL FEMORAL;   Surgeon: Jodi Geralds, MD;  Location: [facility] OR;  Service: Orthopedics;  Laterality: Right;  . TOTAL HIP ARTHROPLASTY      OB History    No data available       Home Medications    Prior to Admission medications   Medication Sig Start Date End Date Taking? Authorizing Provider  aspirin EC 325 MG tablet Take 1 tablet (325 mg total) by mouth daily with breakfast. 08/19/16   Marshia Ly, PA-C  cephALEXin (KEFLEX) 500 MG capsule Take 1 capsule (500 mg total) by mouth 2 (two) times daily. 11/13/16   Sherren Mocha, MD  Cholecalciferol (VITAMIN D3) 5000 units CAPS Take 1 capsule by mouth daily.    [provider]  feeding supplement, ENSURE ENLIVE, (ENSURE ENLIVE) LIQD Take 237 mLs by mouth 3 (three) times daily between meals. 08/21/16   Rai, Delene Ruffini, MD  ferrous sulfate 325 (65 FE) MG tablet Take 1 tablet (325 mg total) by mouth 2 (two) times daily with a meal. 08/21/16   Rai, Ripudeep K, MD  hydrochlorothiazide (HYDRODIURIL) 25 MG tablet Take 1 tablet (25 mg total) by mouth daily as needed (fluid retention, leg swelling). 10/20/16   Sherren Mocha, MD  HYDROcodone-acetaminophen (NORCO) 5-325 MG tablet Take 1 tablet by mouth every 8 (eight) hours as needed for moderate pain. 08/22/16   Richarda Overlie, MD  levothyroxine (SYNTHROID, LEVOTHROID) 50 MCG tablet TAKE 1 TABLET BY MOUTH DAILY BEFORE BREAKFAST 11/10/16   Copland, Gwenlyn Found, MD  methocarbamol (ROBAXIN)  500 MG tablet Take 1 tablet (500 mg total) by mouth every 8 (eight) hours as needed for muscle spasms. 08/22/16   Richarda OverlieAbrol, Nayana, MD  mupirocin ointment (BACTROBAN) 2 % Apply 1 application topically 4 (four) times daily. 07/10/16   Porfirio OarJeffery, Chelle, PA-C  polyethylene glycol (MIRALAX / GLYCOLAX) packet Take 17 g by mouth daily as needed for mild constipation. 08/21/16   Rai, Ripudeep Kirtland BouchardK, MD  Potassium Bicarbonate 99 MG CAPS Take 99 mEq by mouth 1 day or 1 dose.    [provider]  rivastigmine (EXELON) 9.5 mg/24hr PLACE 1 PATCH (9.5 MG TOTAL) ONTO  THE SKIN DAILY 11/17/16   Sherren MochaShaw, Eva N, MD    Family History No family history on file.  Social History Social History  Substance Use Topics  . Smoking status: Former Games developermoker  . Smokeless tobacco: Not on file  . Alcohol use 0.0 oz/week     Allergies   Lisinopril   Review of Systems Review of Systems  Unable to perform ROS: Dementia     Physical Exam Updated Vital Signs BP 105/74 (BP Location: Left Arm)   Pulse 62   Temp 98.2 F (36.8 C) (Oral)   Resp 15   SpO2 92%   Physical Exam  Constitutional: She appears well-developed and well-nourished. No distress.  HENT:  Head: Normocephalic.  Right Ear: External ear normal.  Left Ear: External ear normal.  Mouth/Throat: Oropharynx is clear and moist.  Large laceration to her left frontal head in a semicircle pattern. Bleeding only controlled with pressure. No crepitus. No other visible or palpated signs of head injury or trauma.   Eyes: Pupils are equal, round, and reactive to light. Conjunctivae and EOM are normal. Right eye exhibits no discharge. Left eye exhibits no discharge.     Neck: Normal range of motion. Neck supple.  Cardiovascular: Normal rate, regular rhythm, normal heart sounds and intact distal pulses.   Pulmonary/Chest: Effort normal and breath sounds normal. No respiratory distress.  Abdominal: Soft. There is no tenderness.  Musculoskeletal: She exhibits no tenderness or deformity.  Patient is spontaneously moving all extremities in a coordinated fashion exhibiting good strength.   Lymphadenopathy:    She has no cervical adenopathy.  Neurological: She is alert. Coordination normal.  Alert and oriented to person only.  She keeps asking why we are holding her head due to her laceration.  Speech is clear and coherent. Dementia.   Skin: Skin is warm and dry. Capillary refill takes less than 2 seconds. No rash noted. She is not diaphoretic.  Psychiatric: She has a normal mood and affect. Her behavior is normal.    Nursing note and vitals reviewed.    ED Treatments / Results  Labs (all labs ordered are listed, but only abnormal results are displayed) Labs Reviewed - No data to display  EKG  EKG Interpretation None       Radiology Ct Head Wo Contrast  Result Date: 04/09/2017 CLINICAL DATA:  Head trauma.  Head trauma, minor. EXAM: CT HEAD WITHOUT CONTRAST CT CERVICAL SPINE WITHOUT CONTRAST TECHNIQUE: Multidetector CT imaging of the head and cervical spine was performed following the standard protocol without intravenous contrast. Multiplanar CT image reconstructions of the cervical spine were also generated. COMPARISON:  08/18/2016 head CT FINDINGS: CT HEAD FINDINGS Brain: No evidence of acute infarction, hemorrhage, hydrocephalus, extra-axial collection or mass lesion/mass effect. Atrophy, particularly notable in the medial temporal lobes. Patient has history of dementia and pattern suggests Alzheimer's disease. Chronic small vessel ischemia  with confluent gliosis and remote lacunar/perforator infarcts. Vascular: Atherosclerotic calcification.  No hyperdense vessel. Skull: Stable forehead laceration. No underlying fracture; there is a subjacent contusion. Sinuses/Orbits: No evidence of injury. CT CERVICAL SPINE FINDINGS Alignment: No traumatic malalignment. Skull base and vertebrae: Negative for fracture Soft tissues and spinal canal: No prevertebral fluid or swelling. No visible canal hematoma. Disc levels:  No evidence of degenerative impingement. Upper chest: Partly seen layering pleural effusions. IMPRESSION: 1. No evidence of intracranial or cervical spine injury. 2. Forehead contusion and laceration without fracture. 3. Atrophy in keeping with history of dementia. Chronic small vessel ischemia. 4. Layering pleural effusions. Electronically Signed   By: Marnee Spring M.D.   On: 04/09/2017 14:22   Ct Cervical Spine Wo Contrast  Result Date: 04/09/2017 CLINICAL DATA:  Head trauma.  Head trauma,  minor. EXAM: CT HEAD WITHOUT CONTRAST CT CERVICAL SPINE WITHOUT CONTRAST TECHNIQUE: Multidetector CT imaging of the head and cervical spine was performed following the standard protocol without intravenous contrast. Multiplanar CT image reconstructions of the cervical spine were also generated. COMPARISON:  08/18/2016 head CT FINDINGS: CT HEAD FINDINGS Brain: No evidence of acute infarction, hemorrhage, hydrocephalus, extra-axial collection or mass lesion/mass effect. Atrophy, particularly notable in the medial temporal lobes. Patient has history of dementia and pattern suggests Alzheimer's disease. Chronic small vessel ischemia with confluent gliosis and remote lacunar/perforator infarcts. Vascular: Atherosclerotic calcification.  No hyperdense vessel. Skull: Stable forehead laceration. No underlying fracture; there is a subjacent contusion. Sinuses/Orbits: No evidence of injury. CT CERVICAL SPINE FINDINGS Alignment: No traumatic malalignment. Skull base and vertebrae: Negative for fracture Soft tissues and spinal canal: No prevertebral fluid or swelling. No visible canal hematoma. Disc levels:  No evidence of degenerative impingement. Upper chest: Partly seen layering pleural effusions. IMPRESSION: 1. No evidence of intracranial or cervical spine injury. 2. Forehead contusion and laceration without fracture. 3. Atrophy in keeping with history of dementia. Chronic small vessel ischemia. 4. Layering pleural effusions. Electronically Signed   By: Marnee Spring M.D.   On: 04/09/2017 14:22    Procedures .Marland KitchenLaceration Repair Date/Time: 04/09/2017 12:15 PM Performed by: Everlene Farrier Authorized by: Everlene Farrier   Consent:    Consent obtained:  Emergent situation Laceration details:    Location:  Face   Face location:  Forehead   Length (cm):  14   Depth (mm):  5 Repair type:    Repair type:  Intermediate Exploration:    Hemostasis achieved with:  Direct pressure   Wound exploration: entire depth  of wound probed and visualized     Wound extent: no underlying fracture noted   Treatment:    Amount of cleaning:  Standard Skin repair:    Repair method:  Staples   Number of staples:  20 Approximation:    Approximation:  Close Post-procedure details:    Dressing:  Non-adherent dressing and bulky dressing   Patient tolerance of procedure:  Tolerated well, no immediate complications   (including critical care time)  Medications Ordered in ED Medications  LORazepam (ATIVAN) injection 0.5 mg (0.5 mg Intramuscular Not Given 04/09/17 1519)     Initial Impression / Assessment and Plan / ED Course  I have reviewed the triage vital signs and the nursing notes.  Pertinent labs & imaging results that were available during my care of the patient were reviewed by me and considered in my medical decision making (see chart for details).     This is a 83 y.o. Female who is coming from skilled nursing  facility asked in place by EMS after witnessed fall during a transfer attempt from her wheelchair.  They were trying to transfer her from her wheelchair to bed when she fell hitting her head and sustaining a laceration to the left side of the front of her head.  The head was wrapped prior to arrival.  Patient has a history of dementia and nursing staff reports she is mentating at her baseline mental status.  She has not had any recent illness.  She is not on anticoagulants. She is in hospice care.   I was called to bedside by nursing staff on patient's arrival as they report patient has bleeding from a large forehead laceration.  Patient does have about a 14 cm laceration noted to her left forehead.  Bleeding is controlled with pressure but returns with removal of pressure.  Sutures were placed and dressing applied.  This controlled bleeding very effectively.  Will obtain head CT and cervical spine CT.  No other signs of injury or trauma.  Patient is moving all of her extremities without difficulty.  She  is demented and according to nursing staff is at her baseline.  CT head and cervical spine showed no evidence of intracranial or cervical spine injury.  I was called back to bedside later as the patient had removed her dressing and was bleeding again.  Several more staples were placed for a total of 20 staples.  This very effectively control bleeding again and pressure dressing was applied again.  Patient has dementia and she does not like having the dressing applied.  Patient's daughter came to bedside and I discussed the importance of redirecting her and preventing her from moving the dressing.  Her bleeding is very effectively controlled at this time, but I discussed the risks of her removing the dressing and irritating the wound causing it to bleed again. I recommended close observation by family and nursing staff at the skilled nursing facility until her wounds have healed.  I discussed watching for signs of infection.  I discussed findings of her CT scans.  She will need removal of the stitches in about 7-10 days.  Will discharge back to skilled nursing facility by [facility]. Daughter agrees with plan.   This patient was discussed with and evaluated by Dr. Preston Fleeting who agrees with assessment and plan.   Final Clinical Impressions(s) / ED Diagnoses   Final diagnoses:  Laceration of forehead, initial encounter  Fall, initial encounter    New Prescriptions New Prescriptions   No medications on file     Lorene Dy 04/09/17 1532    Dione Booze, MD 04/09/17 [id number]

## 2017-04-09 NOTE — ED Provider Notes (Signed)
I was asked by nursing to assess the patient's head laceration. Please refer to previous provider's note for specifics of with regards to initial presentation. Imaging reports reviewed and ok. Not anticoagulated.   Large "C" shaped laceration.  Dripping blood from very medial aspect. Nursing reports that on initial assessment that there was pulsatile bleeding. The wound was initially stapled. It continued to bleed and additional staples placed. It has continued to bleed through several pressure dressings. Pt agitated and keeping pulling at them which isn't helping.   LACERATION REPAIR  I removed the staples and reassessed the wound. ~15cm in total length and down to frontal bone. The apex of the flap pretty thin.  Tissue bridging along lateral aspect of wound consistent with blunt trauma. The wound was irrigated. I did not note any pulsatile bleeding. No foreign body or gross contamination. There was brisk venous oozing and bleeding from muscle along the medial aspect of the wound. 6cc of lidocaine with epinephrine was injected along the wound margins and the bleeding area. No further bleeding noted. The wound was loosely approximated with 4 deep sutures with 4-0 vicryl rapide. The skin was closed with running 4-0 mono. This appeared to take care of it. A new pressure dressing was applied.    Pt has been in the ED for over 6 hours now with persistent bleeding.  At this point, I think it's reasonable to check some blood work. Facial/scalp wounds are generally low risk for infection, I irrigated it and it wasn't grossly contaminated. With visible frontal bone and repeat interventions though, a dose of ancef was given. I do not think she needs further antibiotics beyond this. Will need to have sutures removed 7 days.   Will admit for observation.      Raeford RazorKohut, Hailey Paddock, MD 04/20/17 [id number]

## 2017-04-09 NOTE — ED Notes (Signed)
Pt taken to xray prior to getting pain medication

## 2017-04-09 NOTE — ED Notes (Signed)
Pt's daughter called and reports she should in around an hour.

## 2017-04-09 NOTE — ED Notes (Signed)
Patient transported to X-ray 

## 2017-04-09 NOTE — ED Notes (Signed)
Bed: [id number] Expected date:  Expected time:  Means of arrival:  Comments: EMS- elderly, witnessed fall/head lac

## 2017-04-09 NOTE — ED Triage Notes (Signed)
Transported from [facility]. Witnessed fall during transfer attempt from wheelchair. Lacerated noted to left side of head, bleeding controlled. Hx of dementia. Alert and oriented x 1 per baseline.

## 2017-04-09 NOTE — ED Notes (Signed)
Contact made with patient. Patient's wound--cleansed, redressed, and patient repositioned in bed. Patient incontinent or urine and brief changed at this time. Daughter now at bedside and [facility] will be notified regarding transport.

## 2017-04-09 NOTE — H&P (Addendum)
History and Physical    [name] ZOX:[id number] DOB: [birth year] DOA: 04/09/2017  PCP: Patient, No Pcp Per   Patient coming from: SNF- [facility]  Chief Complaint:   HPI: Hailey Taylor is a 83 y.o. female with medical history significant for hypothyroidism, Alzheimer's dementia, hypertension,hospice patient,. Patient has significant dementia so history could not be elicited from patient. History obtained from chart review and EDP.  Pt was brought to the ED, from SNF, with reports that patient fell during transfer to a wheel chair, but pt daughter reports she was told pt was found on the floor. Patient sustained a large C shaped laceration to her head, ~15cm in total length, with significant bleeding that persisted throughout the day, despite initial staples earlier in the day, and pressure dressings, which had to be resutured later in the evening.   ED Course: O2 sats recorded in ED 81%- 92% on room air, 94- 100% on O2. CT head and cervical spine- no intracranial or cervical spine injury, chronic small vessel ischemia, atrophy, layering pleural effusions. ED provider later drew labs as patient had been in the ED for > 6hrs. WBC elevated at 19.8, CBC low but not far from baseline 8.7,  Creatinine mildly elevated at 1.07. UA was ordered and pending.   Review of Systems: due to patient's dementia review of systems could not be done.   Past Medical History:  Diagnosis Date  . Hypertension   . Thyroid disease     Past Surgical History:  Procedure Laterality Date  . FEMUR IM NAIL Right 08/19/2016   Procedure: INTRAMEDULLARY (IM) NAIL FEMORAL;  Surgeon: Jodi Geralds, MD;  Location: [facility] OR;  Service: Orthopedics;  Laterality: Right;  . TOTAL HIP ARTHROPLASTY       reports that she has quit smoking. She does not have any smokeless tobacco history on file. She reports that she drinks alcohol. She reports that she does not use drugs.  Allergies  Allergen Reactions  . Lisinopril Other (See  Comments)    Causes acute renal failure    History reviewed. No pertinent family history.  Prior to Admission medications   Medication Sig Start Date End Date Taking? Authorizing Provider  acetaminophen (TYLENOL) 160 MG/5ML elixir Take 325 mg by mouth 3 (three) times daily.   Yes [provider]  aspirin EC 81 MG tablet Take 81 mg by mouth daily.   Yes [provider]  Cholecalciferol (VITAMIN D3) 5000 units CAPS Take 1 capsule by mouth daily.   Yes [provider]  levothyroxine (SYNTHROID, LEVOTHROID) 50 MCG tablet TAKE 1 TABLET BY MOUTH DAILY BEFORE BREAKFAST 11/10/16  Yes Copland, Gwenlyn Found, MD  LORazepam (ATIVAN) 0.5 MG tablet Take 0.5 mg by mouth every 4 (four) hours as needed for anxiety.   Yes [provider]  Multiple Vitamins-Minerals (DECUBI-VITE PO) Take 1 tablet by mouth daily.   Yes [provider]  Potassium Bicarbonate 99 MG CAPS Take 99 mEq by mouth 1 day or 1 dose.   Yes [provider]  rivastigmine (EXELON) 9.5 mg/24hr PLACE 1 PATCH (9.5 MG TOTAL) ONTO THE SKIN DAILY 11/17/16  Yes Sherren Mocha, MD  aspirin EC 325 MG tablet Take 1 tablet (325 mg total) by mouth daily with breakfast. Patient not taking: Reported on 04/09/2017 08/19/16   Marshia Ly, PA-C  cephALEXin (KEFLEX) 500 MG capsule Take 1 capsule (500 mg total) by mouth 2 (two) times daily. Patient not taking: Reported on 04/09/2017 11/13/16   Clelia Croft,  Levell JulyEva N, MD  feeding supplement, ENSURE ENLIVE, (ENSURE ENLIVE) LIQD Take 237 mLs by mouth 3 (three) times daily between meals. Patient not taking: Reported on 04/09/2017 08/21/16   Rai, Delene Ruffiniipudeep K, MD  ferrous sulfate 325 (65 FE) MG tablet Take 1 tablet (325 mg total) by mouth 2 (two) times daily with a meal. Patient not taking: Reported on 04/09/2017 08/21/16   Rai, Delene Ruffiniipudeep K, MD  hydrochlorothiazide (HYDRODIURIL) 25 MG tablet Take 1 tablet (25 mg total) by mouth daily as needed (fluid retention, leg swelling). Patient not  taking: Reported on 04/09/2017 10/20/16   Sherren MochaShaw, Eva N, MD  HYDROcodone-acetaminophen [facility](NORCO) 5-325 MG tablet Take 1 tablet by mouth every 8 (eight) hours as needed for moderate pain. Patient not taking: Reported on 04/09/2017 08/22/16   Richarda OverlieAbrol, Nayana, MD  methocarbamol (ROBAXIN) 500 MG tablet Take 1 tablet (500 mg total) by mouth every 8 (eight) hours as needed for muscle spasms. Patient not taking: Reported on 04/09/2017 08/22/16   Richarda OverlieAbrol, Nayana, MD  mupirocin ointment (BACTROBAN) 2 % Apply 1 application topically 4 (four) times daily. Patient not taking: Reported on 04/09/2017 07/10/16   Porfirio OarJeffery, Chelle, PA-C  polyethylene glycol (MIRALAX / GLYCOLAX) packet Take 17 g by mouth daily as needed for mild constipation. Patient not taking: Reported on 04/09/2017 08/21/16   Cathren Harshai, Ripudeep K, MD    Physical Exam: limited by patient's significant dementia  Vitals:   04/09/17 1232 04/09/17 1250 04/09/17 1546 04/09/17 1859  BP:   114/78 (!) 157/86  Pulse:   89 80  Resp:   15 20  Temp:      TempSrc:      SpO2: 93% 92% 100% 94%    Constitutional:  Pressure dressing with gauze and bandage around patient's head, minimal bleeding apparent on dressing. Patient has a large C shaped ~15cm , laceration to left side of head. Patient moaning in pain. Vitals:   04/09/17 1232 04/09/17 1250 04/09/17 1546 04/09/17 1859  BP:   114/78 (!) 157/86  Pulse:   89 80  Resp:   15 20  Temp:      TempSrc:      SpO2: 93% 92% 100% 94%   Eyes: PERRL, conjunctiva clear, significant purpuric bruising involving her left eye, more so than her right eye. ENMT: Mucous membranes are moist. Neck: normal,  Respiratory: clear to auscultation bilaterally, no wheezing, no crackles. Normal respiratory effort. No accessory muscle use.  Cardiovascular: Regular rate and rhythm, no murmurs / rubs / gallops. 2+ pitting pedal edema to mid leg. Abdomen: no tenderness, no masses palpated. No hepatosplenomegaly. Bowel sounds positive.    Musculoskeletal: significant purplish bruising involving her left knee, with some swelling. Right lower extremity appears rotated medially Skin: bruising left lower extremity Neurologic: limited by patient's dementia Psychiatric: Normal judgment and insight. Alert and oriented x 3. Normal mood.   Labs on Admission: I have personally reviewed following labs and imaging studies  CBC:  Recent Labs Lab 04/09/17 1813  WBC 19.8*  NEUTROABS 18.0*  HGB 8.7*  HCT 26.4*  MCV 97.4  PLT 245   Basic Metabolic Panel:  Recent Labs Lab 04/09/17 1813  NA 142  K 4.7  CL 107  CO2 23  GLUCOSE 159*  BUN 31*  CREATININE 1.07*  CALCIUM 9.0   Urine analysis:    Component Value Date/Time   COLORURINE YELLOW 08/22/2016 0547   APPEARANCEUR CLEAR 08/22/2016 0547   LABSPEC 1.017 08/22/2016 0547   PHURINE 5.0 08/22/2016 0547  GLUCOSEU NEGATIVE 08/22/2016 0547   HGBUR NEGATIVE 08/22/2016 0547   BILIRUBINUR NEGATIVE 08/22/2016 0547   BILIRUBINUR negative 01/09/2016 1545   KETONESUR NEGATIVE 08/22/2016 0547   PROTEINUR NEGATIVE 08/22/2016 0547   UROBILINOGEN 0.2 01/09/2016 1545   NITRITE NEGATIVE 08/22/2016 0547   LEUKOCYTESUR NEGATIVE 08/22/2016 0547    Radiological Exams on Admission: Ct Head Wo Contrast  Result Date: 04/09/2017 CLINICAL DATA:  Head trauma.  Head trauma, minor. EXAM: CT HEAD WITHOUT CONTRAST CT CERVICAL SPINE WITHOUT CONTRAST TECHNIQUE: Multidetector CT imaging of the head and cervical spine was performed following the standard protocol without intravenous contrast. Multiplanar CT image reconstructions of the cervical spine were also generated. COMPARISON:  08/18/2016 head CT FINDINGS: CT HEAD FINDINGS Brain: No evidence of acute infarction, hemorrhage, hydrocephalus, extra-axial collection or mass lesion/mass effect. Atrophy, particularly notable in the medial temporal lobes. Patient has history of dementia and pattern suggests Alzheimer's disease. Chronic small vessel  ischemia with confluent gliosis and remote lacunar/perforator infarcts. Vascular: Atherosclerotic calcification.  No hyperdense vessel. Skull: Stable forehead laceration. No underlying fracture; there is a subjacent contusion. Sinuses/Orbits: No evidence of injury. CT CERVICAL SPINE FINDINGS Alignment: No traumatic malalignment. Skull base and vertebrae: Negative for fracture Soft tissues and spinal canal: No prevertebral fluid or swelling. No visible canal hematoma. Disc levels:  No evidence of degenerative impingement. Upper chest: Partly seen layering pleural effusions. IMPRESSION: 1. No evidence of intracranial or cervical spine injury. 2. Forehead contusion and laceration without fracture. 3. Atrophy in keeping with history of dementia. Chronic small vessel ischemia. 4. Layering pleural effusions. Electronically Signed   By: Marnee Spring M.D.   On: 04/09/2017 14:22   Ct Cervical Spine Wo Contrast  Result Date: 04/09/2017 CLINICAL DATA:  Head trauma.  Head trauma, minor. EXAM: CT HEAD WITHOUT CONTRAST CT CERVICAL SPINE WITHOUT CONTRAST TECHNIQUE: Multidetector CT imaging of the head and cervical spine was performed following the standard protocol without intravenous contrast. Multiplanar CT image reconstructions of the cervical spine were also generated. COMPARISON:  08/18/2016 head CT FINDINGS: CT HEAD FINDINGS Brain: No evidence of acute infarction, hemorrhage, hydrocephalus, extra-axial collection or mass lesion/mass effect. Atrophy, particularly notable in the medial temporal lobes. Patient has history of dementia and pattern suggests Alzheimer's disease. Chronic small vessel ischemia with confluent gliosis and remote lacunar/perforator infarcts. Vascular: Atherosclerotic calcification.  No hyperdense vessel. Skull: Stable forehead laceration. No underlying fracture; there is a subjacent contusion. Sinuses/Orbits: No evidence of injury. CT CERVICAL SPINE FINDINGS Alignment: No traumatic malalignment.  Skull base and vertebrae: Negative for fracture Soft tissues and spinal canal: No prevertebral fluid or swelling. No visible canal hematoma. Disc levels:  No evidence of degenerative impingement. Upper chest: Partly seen layering pleural effusions. IMPRESSION: 1. No evidence of intracranial or cervical spine injury. 2. Forehead contusion and laceration without fracture. 3. Atrophy in keeping with history of dementia. Chronic small vessel ischemia. 4. Layering pleural effusions. Electronically Signed   By: Marnee Spring M.D.   On: 04/09/2017 14:22   EKG: None  Assessment/Plan Principal Problem:   Fall Active Problems:   Hypertension   Memory loss   Hypothyroidism   Normocytic anemia   Leukocytosis   Protein-calorie malnutrition, severe   Laceration of head  Fall - mechanical, during transfer to wheelchair, witnessed. On aspirin.head CT Cervical CT negative. Sustaining significant laceration to head- stapled initially and then sutured in the ED.  - pelvic x-ray - left knee x-ray - morphine 2 mg 1 - Hydrocodone acetaminophen 05-325 every 4 hourly  Laceration to head- persistent bleeding despite staples initially, pressure dressings, now sutured at this evening. Hemoglobin 8.3, mild drop from baseline ~9. - CBC a.m. - hold antiplatelet  Possible pneumonia- O2 sats documented in ED to 81% on room air, increased 100% on nasal cannula. Also with leukocytosis- 19.8. Nursing home resident. Cervical CT- suggested layering pleural effusions - portable chest x-ray- mild by basilar airspace opacities- pneumonia versus interstitial edema. - broad-spectrum antibiotics vancomycin and Zosyn, per Pharmacy, consider DC vancomycin if MRSA PCR negative. - swallow evaluation  Mild acute kidney injury- creatinine 1, baseline 0.6- 0.7. Na- 142 - hydrate with half-normal saline - BMP am  Alzheimers dementia- Significant Dementia. Not ambulatory. Speech at baseline, is confused and incomprehensible, but  a few times lucid. - continue rivastigmine patch  DVT prophylaxis: Scds  Code Status: DNR  Family Communication: Called daughterCharlott Taylor,  who is pts HCPOA. Confirm the patient's DO NOT RESUSCITATE status. Patient is hospice-for Alzheimer's dementia. Disposition Plan: 1-2 days Consults called: none Admission status: obs.  Onnie Boer MD [facility] Pager [phone]  If 2AM-7AM, please contact night-coverage [email or web address] Password [email or web address]  04/09/2017, 10:19 PM

## 2017-04-09 NOTE — Progress Notes (Signed)
[facility]  RN  Spoke with Will Dansie, PA-C, who advised that patient is currently doing well and bleeding had stopped.  Patient had come to the ED after a witnessed fall at her facility and had a large laceration to head.  Per Will, patient's laceration was stapled and patient is currently in CT to have imaging taken.  Plan at this time is to wait on CT, if all comes back okay, patient will be returned to her facility.  Please feel free to contact me with any hospice related questions.   Thank you,  Adele BarthelAmy Evans, RN, BSN [facility] [phone]  All hospital liaisons are now on AMION.

## 2017-04-09 NOTE — ED Notes (Signed)
[facility] contacted regarding transport at this time.

## 2017-04-10 ENCOUNTER — Encounter (HOSPITAL_COMMUNITY): Payer: Self-pay

## 2017-04-10 ENCOUNTER — Observation Stay (HOSPITAL_COMMUNITY)

## 2017-04-10 DIAGNOSIS — Y92129 Unspecified place in nursing home as the place of occurrence of the external cause: Secondary | ICD-10-CM | POA: Diagnosis not present

## 2017-04-10 DIAGNOSIS — I129 Hypertensive chronic kidney disease with stage 1 through stage 4 chronic kidney disease, or unspecified chronic kidney disease: Secondary | ICD-10-CM | POA: Diagnosis present

## 2017-04-10 DIAGNOSIS — Z66 Do not resuscitate: Secondary | ICD-10-CM | POA: Diagnosis present

## 2017-04-10 DIAGNOSIS — Z7982 Long term (current) use of aspirin: Secondary | ICD-10-CM | POA: Diagnosis not present

## 2017-04-10 DIAGNOSIS — D62 Acute posthemorrhagic anemia: Secondary | ICD-10-CM | POA: Diagnosis present

## 2017-04-10 DIAGNOSIS — D72829 Elevated white blood cell count, unspecified: Secondary | ICD-10-CM | POA: Diagnosis present

## 2017-04-10 DIAGNOSIS — W19XXXA Unspecified fall, initial encounter: Secondary | ICD-10-CM

## 2017-04-10 DIAGNOSIS — N183 Chronic kidney disease, stage 3 (moderate): Secondary | ICD-10-CM | POA: Diagnosis present

## 2017-04-10 DIAGNOSIS — S0181XA Laceration without foreign body of other part of head, initial encounter: Secondary | ICD-10-CM | POA: Diagnosis not present

## 2017-04-10 DIAGNOSIS — E43 Unspecified severe protein-calorie malnutrition: Secondary | ICD-10-CM | POA: Diagnosis present

## 2017-04-10 DIAGNOSIS — E039 Hypothyroidism, unspecified: Secondary | ICD-10-CM | POA: Diagnosis not present

## 2017-04-10 DIAGNOSIS — L8990 Pressure ulcer of unspecified site, unspecified stage: Secondary | ICD-10-CM | POA: Insufficient documentation

## 2017-04-10 DIAGNOSIS — Z79899 Other long term (current) drug therapy: Secondary | ICD-10-CM | POA: Diagnosis not present

## 2017-04-10 DIAGNOSIS — F0280 Dementia in other diseases classified elsewhere without behavioral disturbance: Secondary | ICD-10-CM | POA: Diagnosis present

## 2017-04-10 DIAGNOSIS — D638 Anemia in other chronic diseases classified elsewhere: Secondary | ICD-10-CM | POA: Diagnosis present

## 2017-04-10 DIAGNOSIS — S0101XA Laceration without foreign body of scalp, initial encounter: Secondary | ICD-10-CM | POA: Diagnosis present

## 2017-04-10 DIAGNOSIS — Z681 Body mass index (BMI) 19 or less, adult: Secondary | ICD-10-CM | POA: Diagnosis not present

## 2017-04-10 DIAGNOSIS — Z96649 Presence of unspecified artificial hip joint: Secondary | ICD-10-CM | POA: Diagnosis present

## 2017-04-10 DIAGNOSIS — N179 Acute kidney failure, unspecified: Secondary | ICD-10-CM | POA: Diagnosis present

## 2017-04-10 DIAGNOSIS — J90 Pleural effusion, not elsewhere classified: Secondary | ICD-10-CM | POA: Diagnosis not present

## 2017-04-10 DIAGNOSIS — G309 Alzheimer's disease, unspecified: Secondary | ICD-10-CM | POA: Diagnosis not present

## 2017-04-10 DIAGNOSIS — S66929S Laceration of unspecified muscle, fascia and tendon at wrist and hand level, unspecified hand, sequela: Secondary | ICD-10-CM | POA: Diagnosis not present

## 2017-04-10 DIAGNOSIS — Z87891 Personal history of nicotine dependence: Secondary | ICD-10-CM | POA: Diagnosis not present

## 2017-04-10 DIAGNOSIS — W050XXA Fall from non-moving wheelchair, initial encounter: Secondary | ICD-10-CM | POA: Diagnosis present

## 2017-04-10 DIAGNOSIS — I10 Essential (primary) hypertension: Secondary | ICD-10-CM

## 2017-04-10 DIAGNOSIS — R4182 Altered mental status, unspecified: Secondary | ICD-10-CM | POA: Diagnosis not present

## 2017-04-10 DIAGNOSIS — Z888 Allergy status to other drugs, medicaments and biological substances status: Secondary | ICD-10-CM | POA: Diagnosis not present

## 2017-04-10 LAB — BASIC METABOLIC PANEL
ANION GAP: 10 (ref 5–15)
BUN: 33 mg/dL — ABNORMAL HIGH (ref 6–20)
CO2: 24 mmol/L (ref 22–32)
Calcium: 8.5 mg/dL — ABNORMAL LOW (ref 8.9–10.3)
Chloride: 105 mmol/L (ref 101–111)
Creatinine, Ser: 1.11 mg/dL — ABNORMAL HIGH (ref 0.44–1.00)
GFR calc Af Amer: 48 mL/min — ABNORMAL LOW (ref >60)
GFR, EST NON AFRICAN AMERICAN: 41 mL/min — AB (ref >60)
GLUCOSE: 167 mg/dL — AB (ref 65–99)
POTASSIUM: 4.7 mmol/L (ref 3.5–5.1)
SODIUM: 139 mmol/L (ref 135–145)

## 2017-04-10 LAB — URINALYSIS, ROUTINE W REFLEX MICROSCOPIC
GLUCOSE, UA: NEGATIVE mg/dL
Ketones, ur: NEGATIVE mg/dL
Nitrite: POSITIVE — AB
PROTEIN: 30 mg/dL — AB
Specific Gravity, Urine: 1.015 (ref 1.005–1.030)
pH: 7.5 (ref 5.0–8.0)

## 2017-04-10 LAB — URINALYSIS, MICROSCOPIC (REFLEX): Squamous Epithelial / LPF: NONE SEEN

## 2017-04-10 LAB — RETICULOCYTES
RBC.: 2.29 MIL/uL — AB (ref 3.87–5.11)
RETIC COUNT ABSOLUTE: 52.7 10*3/uL (ref 19.0–186.0)
RETIC CT PCT: 2.3 % (ref 0.4–3.1)

## 2017-04-10 LAB — CBC
HEMATOCRIT: 23.1 % — AB (ref 36.0–46.0)
HEMOGLOBIN: 7.5 g/dL — AB (ref 12.0–15.0)
MCH: 31.6 pg (ref 26.0–34.0)
MCHC: 32.5 g/dL (ref 30.0–36.0)
MCV: 97.5 fL (ref 78.0–100.0)
Platelets: 192 10*3/uL (ref 150–400)
RBC: 2.37 MIL/uL — ABNORMAL LOW (ref 3.87–5.11)
RDW: 15 % (ref 11.5–15.5)
WBC: 12.2 10*3/uL — AB (ref 4.0–10.5)

## 2017-04-10 LAB — TYPE AND SCREEN
ABO/RH(D): A POS
ANTIBODY SCREEN: NEGATIVE

## 2017-04-10 LAB — FOLATE: Folate: 16.8 ng/mL (ref >5.9)

## 2017-04-10 LAB — FERRITIN: FERRITIN: 511 ng/mL — AB (ref 11–307)

## 2017-04-10 LAB — IRON AND TIBC
Iron: 35 ug/dL (ref 28–170)
Saturation Ratios: 14 % (ref 10.4–31.8)
TIBC: 245 ug/dL — ABNORMAL LOW (ref 250–450)
UIBC: 210 ug/dL

## 2017-04-10 LAB — VITAMIN B12: VITAMIN B 12: 820 pg/mL (ref 180–914)

## 2017-04-10 LAB — MRSA PCR SCREENING: MRSA by PCR: NEGATIVE

## 2017-04-10 MED ORDER — PIPERACILLIN-TAZOBACTAM 3.375 G IVPB
3.3750 g | Freq: Three times a day (TID) | INTRAVENOUS | Status: DC
  Administered 2017-04-10 – 2017-04-11 (×5): 3.375 g via INTRAVENOUS
  Filled 2017-04-10 (×6): qty 50

## 2017-04-10 MED ORDER — MORPHINE SULFATE (PF) 4 MG/ML IV SOLN
2.0000 mg | Freq: Once | INTRAVENOUS | Status: DC
  Filled 2017-04-10: qty 1

## 2017-04-10 MED ORDER — VANCOMYCIN HCL IN DEXTROSE 1-5 GM/200ML-% IV SOLN
1000.0000 mg | INTRAVENOUS | Status: AC
  Administered 2017-04-10: 1000 mg via INTRAVENOUS
  Filled 2017-04-10: qty 200

## 2017-04-10 MED ORDER — VANCOMYCIN HCL IN DEXTROSE 750-5 MG/150ML-% IV SOLN
750.0000 mg | INTRAVENOUS | Status: DC
  Filled 2017-04-10: qty 150

## 2017-04-10 MED ORDER — ORAL CARE MOUTH RINSE
15.0000 mL | Freq: Two times a day (BID) | OROMUCOSAL | Status: DC
  Administered 2017-04-10 – 2017-04-11 (×3): 15 mL via OROMUCOSAL

## 2017-04-10 MED ORDER — VANCOMYCIN HCL 500 MG IV SOLR: 500.0000 mg | INTRAVENOUS | Status: DC

## 2017-04-10 MED ORDER — SODIUM CHLORIDE 0.9 % IV SOLN: INTRAVENOUS | Status: AC

## 2017-04-10 MED ORDER — ACETAMINOPHEN 325 MG PO TABS
325.0000 mg | ORAL_TABLET | Freq: Three times a day (TID) | ORAL | Status: DC
  Administered 2017-04-10 – 2017-04-11 (×3): 325 mg via ORAL
  Filled 2017-04-10 (×3): qty 1

## 2017-04-10 MED ORDER — HALOPERIDOL LACTATE 5 MG/ML IJ SOLN: 2.0000 mg | Freq: Four times a day (QID) | INTRAMUSCULAR | Status: DC | PRN

## 2017-04-10 NOTE — Evaluation (Signed)
Clinical/Bedside Swallow Evaluation Patient Details  Name: Hailey Taylor MRN: [id number] Date of Birth: 03/29/1922  Today's Date: 04/10/2017 Time: SLP Start Time (ACUTE ONLY): 1459 SLP Stop Time (ACUTE ONLY): 1545 SLP Time Calculation (min) (ACUTE ONLY): 46 min  Past Medical History:  Past Medical History:  Diagnosis Date  . Hypertension   . Thyroid disease    Past Surgical History:  Past Surgical History:  Procedure Laterality Date  . FEMUR IM NAIL Right 08/19/2016   Procedure: INTRAMEDULLARY (IM) NAIL FEMORAL;  Surgeon: Jodi Geralds, MD;  Location: [facility] OR;  Service: Orthopedics;  Laterality: Right;  . TOTAL HIP ARTHROPLASTY     HPI:  Hailey Taylor a 83 y.o.femalewith medical history significant for hypothyroidism, Alzheimer's dementia, hypertension,hospice patient. Pt was brought to the ED, from SNF, with reports that patient fell during transfer to a wheel chair, but pt daughter reports she was told pt was found on the floor.Patient sustained a large C shaped laceration to her head, ~15cm in total length, with significant bleeding that persisted throughout the day, despite initial staples earlier in the day, and pressure dressings, which had to be resutured later in the evening. CXR: Mild bibasilar airspace opacities may reflect pneumonia or mild interstitial edema. SLP consulted for swallow evaluation.    Assessment / Plan / Recommendation Clinical Impression  Patient presents with moderate risk for aspiration due to acute mental status change with baseline dementia, overall deconditioning. Pt is alert but confused, hunched in right leaning posture. With postural supports able to attain more midline, upright posture adequate for feeding. Pt requires assistance for feeding; overall swallow appears functional and airway protection complete for thin liquids and purees. Pt consumed multiple trials of 3 oz water swallow challenge via straw with no overt signs of aspiration. Belching  noted. Pt masticates regular cracker however retains significant diffuse oral residue. Following regular solid cracker, pt noted with discoordinated swallow during subsequent liquid wash, resulting in explosive cough x1. Pt's daughter stated pt eats regular foods/thin liquids at baseline. Recommend downgrade to dys 1 with thin liquids, meds crushed in puree given her current mentation and deconditioning. Extensive education to daughter re: risks of aspiration, strategies to minimize risks including upright posture, thorough oral care. Will follow for tolerance, advancement of solids.  SLP Visit Diagnosis: Dysphagia, unspecified (R13.10)    Aspiration Risk  Moderate aspiration risk    Diet Recommendation Thin liquid;Dysphagia 1 (Puree);No mixed consistencies   Liquid Administration via: Cup;Straw Medication Administration: Crushed with puree Supervision: Staff to assist with self feeding;Full supervision/cueing for compensatory strategies Compensations: Slow rate;Small sips/bites;Minimize environmental distractions Postural Changes: Seated upright at 90 degrees;Other (Comment) (Use pillows to support midline posture)    Other  Recommendations Oral Care Recommendations: Oral care BID;Staff/trained caregiver to provide oral care   Follow up Recommendations Skilled Nursing facility      Frequency and Duration min 2x/week  1 week       Prognosis Prognosis for Safe Diet Advancement: Fair Barriers to Reach Goals: Cognitive deficits      Swallow Study   General Date of Onset: 04/09/17 HPI: Hailey Taylor a 83 y.o.femalewith medical history significant for hypothyroidism, Alzheimer's dementia, hypertension,hospice patient. Pt was brought to the ED, from SNF, with reports that patient fell during transfer to a wheel chair, but pt daughter reports she was told pt was found on the floor.Patient sustained a large C shaped laceration to her head, ~15cm in total length, with significant  bleeding that persisted throughout the day,  despite initial staples earlier in the day, and pressure dressings, which had to be resutured later in the evening. CXR: Mild bibasilar airspace opacities may reflect pneumonia or mild interstitial edema. SLP consulted for swallow evaluation.  Type of Study: Bedside Swallow Evaluation Previous Swallow Assessment: none in chart Diet Prior to this Study: Thin liquids;Dysphagia 3 (soft) Temperature Spikes Noted: No Respiratory Status: Nasal cannula History of Recent Intubation: No Behavior/Cognition: Alert;Confused;Doesn't follow directions Oral Cavity Assessment:  (limited assessment) Oral Care Completed by SLP: No Oral Cavity - Dentition: Adequate natural dentition Vision: Functional for self-feeding Self-Feeding Abilities: Needs assist Patient Positioning: Upright in bed Baseline Vocal Quality: Normal Volitional Cough: Cognitively unable to elicit Volitional Swallow: Unable to elicit    Oral/Motor/Sensory Function Overall Oral Motor/Sensory Function: Other (comment) (limited assessment)   Ice Chips Ice chips: Within functional limits   Thin Liquid Thin Liquid: Impaired Presentation: Spoon;Straw Pharyngeal  Phase Impairments: Cough - Immediate    Nectar Thick Nectar Thick Liquid: Not tested   Honey Thick Honey Thick Liquid: Not tested   Puree Puree: Within functional limits Presentation: Spoon   Solid   GO   Solid: Impaired Oral Phase Impairments: Poor awareness of bolus;Impaired mastication Oral Phase Functional Implications: Oral residue;Impaired mastication;Prolonged oral transit Pharyngeal Phase Impairments: Cough - Immediate        Hailey LindauMary E Zemira Taylor 04/10/2017,4:01 PM  Rondel BatonMary Beth Teisha Taylor, TennesseeMS, CCC-SLP Speech-Language Pathologist [phone]

## 2017-04-10 NOTE — Progress Notes (Signed)
@IPLOG @        PROGRESS NOTE                                                                                                                                                                                                             Patient Demographics:    Hailey Taylor, is a 83 y.o. female, DOB - 29-Dec-1921, ZOX:[id number]  Admit date - 04/09/2017   Admitting Physician Ejiroghene Wendall Stade, MD  Outpatient Primary MD for the patient is Patient, No Pcp Per  LOS - 0  Chief Complaint  Patient presents with  . Fall       Brief Narrative  Hailey Taylor is a 83 y.o. female with medical history significant for hypothyroidism, Alzheimer's dementia, hypertension,hospice patient,. Patient has significant dementia so history could not be elicited from patient. History obtained from chart review and EDP.  Pt was brought to the ED, from SNF, with reports that patient fell during transfer to a wheel chair, but pt daughter reports she was told pt was found on the floor. Patient sustained a large C shaped laceration to her head, ~15cm in total length, with significant bleeding that persisted throughout the day, despite initial staples earlier in the day, and pressure dressings, which had to be resutured later in the evening.    Subjective:    Hailey Taylor today is in bed, is completely delirious and unable to answer questions or follow commands right really.   Assessment  & Plan :     1. Mechanical fall at SNF with large laceration to the left frontal scalp and extensive facial bruising. X-ray of the left knee and pelvis appear nonacute. She is being treated with supportive care, currently does have extensive delirium for which when necessary Haldol, minimize narcotics and benzodiazepines.   2. Possible pneumonia.clinically cannot definitively rule in or rule out, for now continue on empiric antibiotics for another day then taper down if clinically stable, follow cultures, speech to evaluate to  rule out aspiration. For now soft diet with feeding assistance.  3. Advanced Alzheimer's dementia. Remains at risk for delirium.minimize benzodiazepines and narcotics, Haldol when necessary as needed. Has rivastigmine patch which will be continued.  4. Hypothyroidism. Continue home dose Synthroid.  5. Anemia. Anemia of chronic disease along with some acute blood loss related anemia from the scalp laceration, type screen, check anemia panel, monitor H&H.    Diet : DIET SOFT Room service appropriate? Yes; Fluid consistency: Thin   Family Communication  :  None present  Code  Status :  DO NOT RESUSCITATE  Disposition Plan  : [facility]F with hospice follow-up  Consults  :none  Procedures  :    CT head and CT C-spine. Both nonacute.  DVT Prophylaxis  :  SCDs  Lab Results  Component Value Date   PLT 192 04/10/2017    Inpatient Medications  Scheduled Meds: . feeding supplement (ENSURE ENLIVE)  237 mL Oral TID BM  . ferrous sulfate  325 mg Oral BID WC  . levothyroxine  50 mcg Oral QAC breakfast  . mouth rinse  15 mL Mouth Rinse BID  .  morphine injection  2 mg Intravenous Once  . rivastigmine  9.5 mg Transdermal Daily   Continuous Infusions: . sodium chloride 50 mL/hr at 04/10/17 1054  . piperacillin-tazobactam (ZOSYN)  IV 3.375 g (04/10/17 1015)  . [START ON 04/11/2017] vancomycin     PRN Meds:.haloperidol lactate, HYDROcodone-acetaminophen, LORazepam, ondansetron **OR** ondansetron (ZOFRAN) IV, polyethylene glycol  Antibiotics  :    Anti-infectives    Start     Dose/Rate Route Frequency Ordered Stop   04/11/17 2200  vancomycin (VANCOCIN) IVPB 750 mg/150 ml premix     750 mg 150 mL/hr over 60 Minutes Intravenous Every 48 hours 04/10/17 0828     04/11/17 0300  vancomycin (VANCOCIN) 500 mg in sodium chloride 0.9 % 100 mL IVPB  Status:  Discontinued     500 mg 100 mL/hr over 60 Minutes Intravenous Every 24 hours 04/10/17 0205 04/10/17 0828   04/10/17 0300   piperacillin-tazobactam (ZOSYN) IVPB 3.375 g     3.375 g 12.5 mL/hr over 240 Minutes Intravenous Every 8 hours 04/10/17 0205     04/10/17 0230  vancomycin (VANCOCIN) IVPB 1000 mg/200 mL premix     1,000 mg 200 mL/hr over 60 Minutes Intravenous STAT 04/10/17 0205 04/10/17 0330   04/09/17 1830  ceFAZolin (ANCEF) IVPB 1 g/50 mL premix     1 g 100 mL/hr over 30 Minutes Intravenous  Once 04/09/17 1820 04/09/17 2245         Objective:   Vitals:   04/09/17 2350 04/10/17 0202 04/10/17 0243 04/10/17 0653  BP: 91/73   (!) 109/98  Pulse: 86   77  Resp: 20   20  Temp: 97.7 F (36.5 C)   97.8 F (36.6 C)  TempSrc: Oral   Oral  SpO2: (!) 87%  100% 100%  Weight: 50.3 kg (110 lb 12.8 oz)     Height:  5\' 5"  (1.651 m)      Wt Readings from Last 3 Encounters:  04/09/17 50.3 kg (110 lb 12.8 oz)  08/19/16 46.3 kg (102 lb)  05/21/16 46.5 kg (102 lb 9.6 oz)     Intake/Output Summary (Last 24 hours) at 04/10/17 1115 Last data filed at 04/10/17 1030  Gross per 24 hour  Intake           976.67 ml  Output                0 ml  Net           976.67 ml     Physical Exam  Awake , medicine confused, moving all 4 extremities, large laceration on the left frontal scalp with large bruises around both eyeballs Supple Neck,No JVD, No cervical lymphadenopathy appriciated.  Symmetrical Chest wall movement, Good air movement bilaterally, CTAB RRR,No Gallops,Rubs or new Murmurs, No Parasternal Heave +ve B.Sounds, Abd Soft, No tenderness, No organomegaly appriciated, No rebound - guarding or rigidity. No Cyanosis,  Clubbing or edema, No new Rash or bruise     Data Review:    CBC  Recent Labs Lab 04/09/17 1813 04/10/17 0410  WBC 19.8* 12.2*  HGB 8.7* 7.5*  HCT 26.4* 23.1*  PLT 245 192  MCV 97.4 97.5  MCH 32.1 31.6  MCHC 33.0 32.5  RDW 14.8 15.0  LYMPHSABS 0.8  --   MONOABS 1.0  --   EOSABS 0.0  --   BASOSABS 0.0  --     Chemistries   Recent Labs Lab 04/09/17 1813 04/10/17 0410   NA 142 139  K 4.7 4.7  CL 107 105  CO2 23 24  GLUCOSE 159* 167*  BUN 31* 33*  CREATININE 1.07* 1.11*  CALCIUM 9.0 8.5*   ------------------------------------------------------------------------------------------------------------------ No results for input(s): CHOL, HDL, LDLCALC, TRIG, CHOLHDL, LDLDIRECT in the last 72 hours.  No results found for: HGBA1C ------------------------------------------------------------------------------------------------------------------ No results for input(s): TSH, T4TOTAL, T3FREE, THYROIDAB in the last 72 hours.  Invalid input(s): FREET3 ------------------------------------------------------------------------------------------------------------------ No results for input(s): VITAMINB12, FOLATE, FERRITIN, TIBC, IRON, RETICCTPCT in the last 72 hours.  Coagulation profile No results for input(s): INR, PROTIME in the last 168 hours.  No results for input(s): DDIMER in the last 72 hours.  Cardiac Enzymes No results for input(s): CKMB, TROPONINI, MYOGLOBIN in the last 168 hours.  Invalid input(s): CK ------------------------------------------------------------------------------------------------------------------ No results found for: BNP  Micro Results Recent Results (from the past 240 hour(s))  MRSA PCR Screening     Status: None   Collection Time: 04/10/17 12:24 AM  Result Value Ref Range Status   MRSA by PCR NEGATIVE NEGATIVE Final    Comment:        The GeneXpert MRSA Assay (FDA approved for NASAL specimens only), is one component of a comprehensive MRSA colonization surveillance program. It is not intended to diagnose MRSA infection nor to guide or monitor treatment for MRSA infections.     Radiology Reports  Dg Pelvis 1-2 Views  Result Date: 04/09/2017 CLINICAL DATA:  Status post fall, with concern for pelvic injury. Initial encounter. EXAM: PELVIS - 1-2 VIEW COMPARISON:  None. FINDINGS: There is no evidence of acute  fracture or dislocation. There is chronic deformity of the left superior and inferior pubic rami. The patient's left hip hemiarthroplasty is unremarkable. A right femoral intramedullary nail and screw are noted. There is no evidence of loosening of hardware. The sacroiliac joints are grossly unremarkable. The visualized bowel gas pattern is grossly unremarkable in appearance. IMPRESSION: 1. No evidence of fracture or dislocation. 2. Bilateral femoral hardware appears grossly intact, without evidence of loosening. Electronically Signed   By: Roanna Raider M.D.   On: 04/09/2017 22:56   Ct Head Wo Contrast  Result Date: 04/09/2017 CLINICAL DATA:  Head trauma.  Head trauma, minor. EXAM: CT HEAD WITHOUT CONTRAST CT CERVICAL SPINE WITHOUT CONTRAST TECHNIQUE: Multidetector CT imaging of the head and cervical spine was performed following the standard protocol without intravenous contrast. Multiplanar CT image reconstructions of the cervical spine were also generated. COMPARISON:  08/18/2016 head CT FINDINGS: CT HEAD FINDINGS Brain: No evidence of acute infarction, hemorrhage, hydrocephalus, extra-axial collection or mass lesion/mass effect. Atrophy, particularly notable in the medial temporal lobes. Patient has history of dementia and pattern suggests Alzheimer's disease. Chronic small vessel ischemia with confluent gliosis and remote lacunar/perforator infarcts. Vascular: Atherosclerotic calcification.  No hyperdense vessel. Skull: Stable forehead laceration. No underlying fracture; there is a subjacent contusion. Sinuses/Orbits: No evidence of injury. CT CERVICAL SPINE FINDINGS Alignment: No traumatic malalignment.  Skull base and vertebrae: Negative for fracture Soft tissues and spinal canal: No prevertebral fluid or swelling. No visible canal hematoma. Disc levels:  No evidence of degenerative impingement. Upper chest: Partly seen layering pleural effusions. IMPRESSION: 1. No evidence of intracranial or cervical  spine injury. 2. Forehead contusion and laceration without fracture. 3. Atrophy in keeping with history of dementia. Chronic small vessel ischemia. 4. Layering pleural effusions. Electronically Signed   By: Marnee SpringJonathon  Watts M.D.   On: 04/09/2017 14:22   Ct Cervical Spine Wo Contrast  Result Date: 04/09/2017 CLINICAL DATA:  Head trauma.  Head trauma, minor. EXAM: CT HEAD WITHOUT CONTRAST CT CERVICAL SPINE WITHOUT CONTRAST TECHNIQUE: Multidetector CT imaging of the head and cervical spine was performed following the standard protocol without intravenous contrast. Multiplanar CT image reconstructions of the cervical spine were also generated. COMPARISON:  08/18/2016 head CT FINDINGS: CT HEAD FINDINGS Brain: No evidence of acute infarction, hemorrhage, hydrocephalus, extra-axial collection or mass lesion/mass effect. Atrophy, particularly notable in the medial temporal lobes. Patient has history of dementia and pattern suggests Alzheimer's disease. Chronic small vessel ischemia with confluent gliosis and remote lacunar/perforator infarcts. Vascular: Atherosclerotic calcification.  No hyperdense vessel. Skull: Stable forehead laceration. No underlying fracture; there is a subjacent contusion. Sinuses/Orbits: No evidence of injury. CT CERVICAL SPINE FINDINGS Alignment: No traumatic malalignment. Skull base and vertebrae: Negative for fracture Soft tissues and spinal canal: No prevertebral fluid or swelling. No visible canal hematoma. Disc levels:  No evidence of degenerative impingement. Upper chest: Partly seen layering pleural effusions. IMPRESSION: 1. No evidence of intracranial or cervical spine injury. 2. Forehead contusion and laceration without fracture. 3. Atrophy in keeping with history of dementia. Chronic small vessel ischemia. 4. Layering pleural effusions. Electronically Signed   By: Marnee SpringJonathon  Watts M.D.   On: 04/09/2017 14:22   Dg Chest Port 1 View  Result Date: 04/10/2017 CLINICAL DATA:  SOb today  EXAM: PORTABLE CHEST 1 VIEW COMPARISON:  04/09/2017 FINDINGS: Heart size is accentuated by the technique. Suspect cardiomegaly. Tortuous aorta. There are bibasilar opacities obscuring the hemidiaphragms. Bilateral pleural effusions are present. IMPRESSION: Persistent bilateral pleural effusions and bibasilar atelectasis or infiltrates. Electronically Signed   By: Norva PavlovElizabeth  Brown M.D.   On: 04/10/2017 08:25   Dg Chest Port 1 View  Result Date: 04/09/2017 CLINICAL DATA:  Acute onset of shortness of breath. Initial encounter. EXAM: PORTABLE CHEST 1 VIEW COMPARISON:  Chest radiograph performed 08/18/2016 FINDINGS: The lungs are well-aerated. Mild vascular congestion is noted. Mild bibasilar airspace opacities may reflect pneumonia or mild interstitial edema. A small right pleural effusion is noted. No pneumothorax is seen. The lung apices are partially obscured by the patient's head. The cardiomediastinal silhouette is borderline enlarged. No acute osseous abnormalities are seen. There is chronic deformity of the right humeral head. IMPRESSION: Mild vascular congestion and borderline cardiomegaly. Mild bibasilar airspace opacities may reflect pneumonia or mild interstitial edema. Small right pleural effusion noted. Electronically Signed   By: Roanna RaiderJeffery  Chang M.D.   On: 04/09/2017 22:57   Dg Knee 3 Views Left  Result Date: 04/09/2017 CLINICAL DATA:  Status post fall, with left knee bruising. Initial encounter. EXAM: LEFT KNEE - 3 VIEW COMPARISON:  None. FINDINGS: There is no evidence of fracture or dislocation. The joint spaces are preserved. No significant degenerative change is seen; the patellofemoral joint is grossly unremarkable in appearance. No significant joint effusion is seen. Scattered vascular calcifications are seen. IMPRESSION: 1. No evidence of fracture or dislocation. 2. Scattered vascular calcifications  seen. Electronically Signed   By: Roanna Raider M.D.   On: 04/09/2017 22:57    Time  Spent in minutes  30   Susa Raring M.D on 04/10/2017 at 11:15 AM  Between 7am to 7pm - Pager - [phone] ( page via [email or web address], text pages only, please mention full 10 digit call back number). After 7pm go to [email or web address] - password [facility]

## 2017-04-10 NOTE — Progress Notes (Signed)
Palliative Medicine Consult received  Patient is 83 year old currently on hospice.  I called and spoke with Adele [facility]helAmy [facility] as well as Dr. Thedore MinsSingh.  Plan is for discharge back to prior facility with hospice support when she is medically ready.    [facility] to round on patient later today.  I have asked her to let me know if I can be of assistance, but as goals seem clear, will hold on seeing patient at this time.  Romie MinusGene Laurali Goddard, MD [facility] Palliative Medicine Team [phone]  NO CHARGE NOTE

## 2017-04-10 NOTE — Progress Notes (Addendum)
Pharmacy - Brief Note (vancomycin dosing)  D1 Vancomycin/zosyn for PNA   Today, 04/10/2017 - SCr slightly up this am - MRSA PCR neg  Plan: - Adjust vancomycin to 750mg  IV q48h - Suggest stopping vancomycin with negative MRSA PCR - If vancomycin not stopped, suggest change zosyn to cefepime to reduce risk of AKI - Zosyn dose appropriate for now, continue to watch renal function  Juliette Alcideustin Zeigler, PharmD, BCPS.   Pager: [phone] 04/10/2017 8:32 AM

## 2017-04-10 NOTE — Consult Note (Signed)
WOC Nurse wound consult note Reason for Consult: Stage 3 to right posterior heel. MASD, specifically incontinence associated dermatitis to the buttocks, medial thighs and perineal areas.  Patient with external powered urinary incontinence management device (PURWick), but has fecal incontinence as well.  NOTE:  Patient with forehead laceration approximated with sutures, also extensive bruising at face, left LE secondary to traumatic injury. Wound type: Pressure, Moisture, trauma Pressure Injury POA: Yes.  Stage 3 to right posterior heel Measurement: Right heel Pressure Injury:  Stage 3:  2.5cm x 2cm x 0.2cm with 25% yellow slough and 75% red, moist tissue.  Minimal serous exudate. Buttocks, Medial thighs and perineal area with erythema, edema consistent with UI/FI despite PURWick being in place. Wound bed:As noted above Drainage (amount, consistency, odor) As noted above Periwound: intact, dry.   Dressing procedure/placement/frequency: Nursing is provided with guidance via the Orders for incontinence care, positioning and topical wound care for right heel.  Pressure redistribution devices for heels and for chair are provided. Patient is in a low bed for fall precautions and safety. She has mittens in place bilaterally as her daughter recently left the room. POC communicated to bedside RN.  WOC nursing team will not follow, but will remain available to this patient, the nursing and medical teams.  Please re-consult if needed. Thanks, Ladona MowLaurie Nedim Oki, MSN, RN, GNP, Hans EdenCWOCN, CWON-AP, FAAN  Pager# [phone]

## 2017-04-10 NOTE — Progress Notes (Signed)
Pharmacy Antibiotic Note  Hailey Taylor is a 83 y.o. female admitted on 04/09/2017 with pneumonia.  Patient was brought to the ED s/p fall and sustaining a head laceration. Patient received Cefazolin 1gm IV x 1 @ 18:40 on 10/26.  While in ED noted to have lower O2 sats. Chest xray suggestive of pneumonia. Pharmacy has been consulted for Vancomycin and Zosyn dosing.  Plan:  Vancomycin 1gm IV x 1 loading dose followed by 500mg  IV q24h  Zosyn 3.375gm IV q8h (each dose infused over 4 hrs)  Follow daily SCr while on Vanc & Zosyn  Check vancomycin trough level when appropriate  Height: 5\' 5"  (165.1 cm) Weight: 110 lb 12.8 oz (50.3 kg) IBW/kg (Calculated) : 57  Temp (24hrs), Avg:98 F (36.7 C), Min:97.7 F (36.5 C), Max:98.2 F (36.8 C)   Recent Labs Lab 04/09/17 1813  WBC 19.8*  CREATININE 1.07*    Estimated Creatinine Clearance: 26.1 mL/min (A) (by C-G formula based on SCr of 1.07 mg/dL (H)).    Allergies  Allergen Reactions  . Lisinopril Other (See Comments)    Causes acute renal failure    Antimicrobials this admission: 10/26 Cefazolin x 1  10/27 Vanc >>   10/27 Zosyn >>  Dose adjustments this admission:    Microbiology results:  10/27 MRSA PCR: negative  Thank you for allowing pharmacy to be a part of this patient's care.  Maryellen PilePoindexter, Braedin Millhouse Trefz, PharmD 04/10/2017 2:06 AM

## 2017-04-10 NOTE — Progress Notes (Signed)
[facility] - [facility] - GIP RN visit at 1230pm.   This is a related and covered GIP admission of 04/09/17 with [facility] diagnosis of Alzheimer's dementia, per Dr. Barbee ShropshireHertweck.  Patient has an OOF DNR.  [facility] after patient had a fall during transfer from wheelchair to bed.  Unclear if [facility] was contacted prior to EMS being activated by SNF staff.  Patient was found to have PNA during ED visit and admitted to hospital.   Visited with patient at bedside.  Patient was alert and disoriented.  Patient was clearly agitated and restless.  Patient had mitts on L hand, but had managed to get the R mitt off and it was in her bed.  Patient with O2 mask pulled off her face.  Patient with a lot of bruising around eyes and "C" shaped, closed laceration to L forehead.   Patient denied pain, but I do not think she understands the question.  Spoke with CNA about replacing the mitts and O2 mask.  She went to room to address.  Left message for bedside RN.   Patient receiving:  Feeding supplement (ENSURE ENLIVE) liquid 237 mL, Dose 237 mL, TID via PO.  Continuous medications:  0.9% sodium chloride infusion, Rate 50 mL/hr, Continuous via IV; piperacillin-tazobactam (ZOSYN) IVPB 3.375 g, Dose 3.375 g, Q8H via IV; vancomycin (VANCOCIN) IVPB 750 mg/150 ml premix, Dose 750 mg, Q48H via IV.  PRN medications: LORazepam (ATIVAN) tablet 0.5 mg, Dose 0.5 mg, Q4H PRN via PO at 1356 today.   Dr. Barbee ShropshireHertweck and Dr. Joeseph AmorBlass notified of patient admission.  Transfer summary and medication list will be placed on shadow chart.   We will continue monitoring patient while hospitalized and anticipate any discharge needs.   Thank you.  Adele BarthelAmy Evans, RN, BSN [facility] Hospital Liaison [phone]  All hospital liaisons are now on AMION.

## 2017-04-10 NOTE — Progress Notes (Signed)
Pt. rubbed forehead stitches with mitted hands and cased slight bleeding. Stitches remain intact and incision approximated. Area cleaned with normal saline and foam, nonadherent dressing applied.

## 2017-04-11 ENCOUNTER — Encounter (HOSPITAL_COMMUNITY): Payer: Self-pay

## 2017-04-11 DIAGNOSIS — S0181XA Laceration without foreign body of other part of head, initial encounter: Secondary | ICD-10-CM

## 2017-04-11 LAB — BASIC METABOLIC PANEL
ANION GAP: 11 (ref 5–15)
BUN: 34 mg/dL — ABNORMAL HIGH (ref 6–20)
CALCIUM: 8.6 mg/dL — AB (ref 8.9–10.3)
CO2: 24 mmol/L (ref 22–32)
Chloride: 106 mmol/L (ref 101–111)
Creatinine, Ser: 1.02 mg/dL — ABNORMAL HIGH (ref 0.44–1.00)
GFR calc Af Amer: 53 mL/min — ABNORMAL LOW (ref >60)
GFR calc non Af Amer: 46 mL/min — ABNORMAL LOW (ref >60)
GLUCOSE: 113 mg/dL — AB (ref 65–99)
POTASSIUM: 4 mmol/L (ref 3.5–5.1)
Sodium: 141 mmol/L (ref 135–145)

## 2017-04-11 LAB — CBC
HEMATOCRIT: 23.6 % — AB (ref 36.0–46.0)
Hemoglobin: 7.6 g/dL — ABNORMAL LOW (ref 12.0–15.0)
MCH: 31.3 pg (ref 26.0–34.0)
MCHC: 32.2 g/dL (ref 30.0–36.0)
MCV: 97.1 fL (ref 78.0–100.0)
Platelets: 158 10*3/uL (ref 150–400)
RBC: 2.43 MIL/uL — AB (ref 3.87–5.11)
RDW: 15.2 % (ref 11.5–15.5)
WBC: 8.2 10*3/uL (ref 4.0–10.5)

## 2017-04-11 MED ORDER — HYDROCODONE-ACETAMINOPHEN 5-325 MG PO TABS: 1.0000 | ORAL_TABLET | Freq: Once | ORAL | Status: AC

## 2017-04-11 MED ORDER — LEVOFLOXACIN 500 MG PO TABS: 500.0000 mg | ORAL_TABLET | Freq: Every day | ORAL | 0 refills | Status: DC

## 2017-04-11 MED ORDER — LEVOFLOXACIN 500 MG PO TABS: 250.0000 mg | ORAL_TABLET | Freq: Every day | ORAL | 0 refills | Status: AC

## 2017-04-11 NOTE — Social Work (Signed)
Clinical Social Worker facilitated patient discharge including contacting patient family and facility to confirm patient discharge plans.  Clinical information faxed to facility and family agreeable with plan.    CSW arranged ambulance transport via [facility] to [facility] at 4:00pm.   RN to call [phone] to give report prior to discharge. Pt going to Room 406B.  Clinical Social Worker will sign off for now as social work intervention is no longer needed. Please consult us again if new need arises.  Keene BreathPatricia Dequante Tremaine, LCSW Clinical Social Worker [phone]

## 2017-04-11 NOTE — Discharge Instructions (Signed)
Patient will need constant attention for the next several days to ensure she does not remove her bandage. She needs her dressing changed twice a day at least and should be checked immediately if it is soiled. She has 20 staples in her forehead that will need to be removed in about 7-10 days. Please watch for signs of infection. Please follow up with her doctor this week.   Follow with Primary MD in 7 days   Get CBC, CMP,   checked  By  SNF MD in 5-7 days  Activity: As tolerated with Full fall precautions use walker/cane & assistance as needed  Disposition SNF  Diet:   DIET - DYS 1 with feeding assistance and aspiration precautions.  For Heart failure patients - Check your Weight same time everyday, if you gain over 2 pounds, or you develop in leg swelling, experience more shortness of breath or chest pain, call your Primary MD immediately. Follow Cardiac Low Salt Diet and 1.5 lit/day fluid restriction.  On your next visit with your primary care physician please Get Medicines reviewed and adjusted.  Please request your Prim.MD to go over all Hospital Tests and Procedure/Radiological results at the follow up, please get all Hospital records sent to your Prim MD by signing hospital release before you go home.  If you experience worsening of your admission symptoms, develop shortness of breath, life threatening emergency, suicidal or homicidal thoughts you must seek medical attention immediately by calling 911 or calling your MD immediately  if symptoms less severe.  You Must read complete instructions/literature along with all the possible adverse reactions/side effects for all the Medicines you take and that have been prescribed to you. Take any new Medicines after you have completely understood and accpet all the possible adverse reactions/side effects.

## 2017-04-11 NOTE — [place] FL2 (Signed)
[place] MEDICAID FL2 LEVEL OF CARE SCREENING TOOL     IDENTIFICATION  Patient Name: Hailey Taylor Birthdate: [birth year] Sex: female Admission Date (Current Location): 04/09/2017  [facility] and [place] Number:  Producer, television/film/video and Address:  [facility] [place], [address], [place], [place] [address]      Provider Number:    Attending Physician Name and Address:  Leroy Sea, MD  Relative Name and Phone Number:  Charlott Holler, daughter, [phone]    Current Level of Care: SNF Recommended Level of Care: Assisted Living Facility Prior Approval Number:    Date Approved/Denied:   PASRR Number: [id number]  Discharge Plan: SNF    Current Diagnoses: Patient Active Problem List   Diagnosis Date Noted  . Laceration of forehead   . Pressure injury of skin 04/10/2017  . Laceration of head 04/09/2017  . Acute blood loss anemia 08/21/2016  . Protein-calorie malnutrition, severe 08/19/2016  . Displaced intertrochanteric fracture of right femur, initial encounter for closed fracture (HCC) 08/18/2016  . Fall 08/18/2016  . Normocytic anemia 08/18/2016  . Hypokalemia 08/18/2016  . Leukocytosis 08/18/2016  . 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 Dehydration 11/25/2015  . Urinary incontinence without sensory awareness 11/25/2015  . Late onset Alzheimer's disease without behavioral disturbance 07/10/2015  . 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 Hypertension 10/08/2011  . Memory loss 10/08/2011  . Hypothyroidism 10/08/2011  . History of fracture of hip 10/18/2010    Orientation RESPIRATION BLADDER Height & Weight      (Disoriented)  Normal Incontinent Weight: 110 lb 12.8 oz (50.3 kg) Height:  5\' 5"  (165.1 cm)  BEHAVIORAL SYMPTOMS/MOOD NEUROLOGICAL BOWEL NUTRITION STATUS      Incontinent Diet  AMBULATORY STATUS COMMUNICATION OF NEEDS Skin   Extensive Assist Verbally PU Stage and Appropriate Care (Only Stage II  )   PU Stage 2 Dressing: Daily                   Personal Care Assistance Level of Assistance  Dressing, Feeding, Bathing Bathing Assistance: Maximum assistance Feeding assistance: Maximum assistance Dressing Assistance: Maximum assistance Total Care Assistance: Maximum assistance   Functional Limitations Info             SPECIAL CARE FACTORS FREQUENCY                       Contractures Contractures Info: Not present    Additional Factors Info  Code Status, Allergies Code Status Info: DNR Allergies Info: LISINOPRIL            Current Medications (04/11/2017):  This is the current hospital active medication list Current Facility-Administered Medications  Medication Dose Route Frequency Provider Last Rate Last Dose  . acetaminophen (TYLENOL) tablet 325 mg  325 mg Oral TID Leroy Sea, MD   325 mg at 04/10/17 2224  . feeding supplement (ENSURE ENLIVE) (ENSURE ENLIVE) liquid 237 mL  237 mL Oral TID BM Emokpae, Ejiroghene E, MD   237 mL at 04/10/17 1340  . ferrous sulfate tablet 325 mg  325 mg Oral BID WC Emokpae, Ejiroghene E, MD   325 mg at 04/10/17 1720  . haloperidol lactate (HALDOL) injection 2 mg  2 mg Intravenous Q6H PRN Leroy Sea, MD      . HYDROcodone-acetaminophen (NORCO/VICODIN) 5-325 MG per tablet 1 tablet  1 tablet Oral Q4H PRN Emokpae, Ejiroghene E, MD   1 tablet at 04/11/17 0043  .  levothyroxine (SYNTHROID, LEVOTHROID) tablet 50 mcg  50 mcg Oral QAC breakfast Emokpae, Ejiroghene E, MD   50 mcg at 04/10/17 0743  . LORazepam (ATIVAN) tablet 0.5 mg  0.5 mg Oral Q4H PRN Emokpae, Ejiroghene E, MD   0.5 mg at 04/10/17 1356  . MEDLINE mouth rinse  15 mL Mouth Rinse BID Leroy SeaSingh, Prashant K, MD   15 mL at 04/10/17 2200  . morphine 4 MG/ML injection 2 mg  2 mg Intravenous Once Emokpae, Ejiroghene E, MD      . ondansetron (ZOFRAN) tablet 4 mg  4 mg Oral Q6H PRN Emokpae, Ejiroghene E, MD       Or  . ondansetron (ZOFRAN) injection 4 mg  4 mg  Intravenous Q6H PRN Emokpae, Ejiroghene E, MD      . piperacillin-tazobactam (ZOSYN) IVPB 3.375 g  3.375 g Intravenous Q8H Poindexter, Leann T, RPH 12.5 mL/hr at 04/11/17 0340 3.375 g at 04/11/17 0340  . polyethylene glycol (MIRALAX / GLYCOLAX) packet 17 g  17 g Oral Daily PRN Emokpae, Ejiroghene E, MD      . rivastigmine (EXELON) 9.5 mg/24hr 9.5 mg  9.5 mg Transdermal Daily Emokpae, Ejiroghene E, MD   9.5 mg at 04/10/17 0949  . vancomycin (VANCOCIN) IVPB 750 mg/150 ml premix  750 mg Intravenous Q48H Aleda GranaZeigler, Dustin G, [facility]         Discharge Medications: Please see discharge summary for a list of discharge medications.  Relevant Imaging Results:  Relevant Lab Results:   Additional Information SS#: [id number]    [place] Dona V [place] Middle[place], LCSW

## 2017-04-11 NOTE — Progress Notes (Signed)
[facility] 1322 - [facility] - GIP RN visit at 0815am.   This is a related and covered GIP admission of 04/09/17 with [facility] diagnosis of Alzheimer's dementia, per Dr. Barbee ShropshireHertweck.  Patient has an OOF DNR.  [facility] after patient had a fall during transfer from wheelchair to bed.  Unclear if [facility] was contacted prior to EMS being activated by SNF staff.  Patient was found to have PNA during ED visit and admitted to hospital.   Visited with patient at bedside.  Patient was lethargic and hard to rouse this morning.  Patient was in fetal position on left side in the bed.  Patient in NAD.  Patient with bandaid to forehead.  She has been rubbing the stitched area last night and caused some light bleeding - the bandage is not soiled to eye.  Bleeding appears to have stopped.  Bruising remains around eyes.  MD came to bedside and advised that he will be discharging today.  He will change IV antibiotics to PO.    Patient receiving:  feeding supplement (ENSURE ENLIVE) liquid 237 mL, Dose 237 mL, TID via PO; ferrous sulfate tablet 325 mg, Dose 325 mg, BID via PO.  Continuous medications:  0.9% sodium chloride infusion, Rate 50 mL/hr, Continuous via IV; piperacillin-tazobactam (ZOSYN) ivpb 3.375 g, Dose 3.375 g, Q8H via IV; vancomycin (VANCOCIN) IVPB 750 mg/150 ml premix, Dose 750 mg, Q48H via IV.  PRN medications:  HYDROcodone-acetaminophen (NORCO/VICODIN) 5-325 MG per tablet 1 tablet, Dose 1 tablet, Q4H PRN via PO given at 0043 today.    If you have any hospice related questions, please feel free to call me.   Thank you.  Adele BarthelAmy Evans, RN, BSN [facility] Hospital Liaison [phone]  All hospital liaisons are now on AMION.

## 2017-04-11 NOTE — Clinical Social Work Placement (Signed)
   CLINICAL SOCIAL WORK PLACEMENT  NOTE  Date:  04/11/2017  Patient Details  Name: Hailey Taylor MRN: [id number] Date of Birth: 01/17/1922  Clinical Social Work is seeking post-discharge placement for this patient at the Skilled  Nursing Facility level of care (*CSW will initial, date and re-position this form in  chart as items are completed):  Yes   Patient/family provided with [facility] Clinical Social Work Department's list of facilities offering this level of care within the geographic area requested by the patient (or if unable, by the patient's family).  Yes   Patient/family informed of their freedom to choose among providers that offer the needed level of care, that participate in Medicare, Medicaid or managed care program needed by the patient, have an available bed and are willing to accept the patient.  Yes   Patient/family informed of [facility]'s ownership interest in [facility] and [facility], as well as of the fact that they are under no obligation to receive care at these facilities.  PASRR submitted to EDS on       PASRR number received on       Existing PASRR number confirmed on 04/11/17     FL2 transmitted to all facilities in geographic area requested by pt/family on       FL2 transmitted to all facilities within larger geographic area on 04/11/17     Patient informed that his/her managed care company has contracts with or will negotiate with certain facilities, including the following:        Yes   Patient/family informed of bed offers received.  [facility]     Physician recommends and patient chooses bed at      [facility] 04[facility]11/[facility].  Patient to be transferred to facility by [facility]     Patient family notified on 04/11/17 of transfer.  Name of family member notified:  daughter Darel HongJudy     PHYSICIAN Please sign FL2     Additional Comment:     _______________________________________________ Tresa MoorePatricia V Janylah Belgrave, LCSW 04/11/2017, 10:52 AM

## 2017-04-11 NOTE — Progress Notes (Signed)
PT Cancellation Note  Patient Details Name: Randol KernFaye D Chohan MRN: [id number] DOB: 04/20/1922   Cancelled Treatment:     PT order received but eval deferred.  RN advises pt to dc back to SNF this date.  Will follow and assess if dc plans change.   Joselle Deeds 04/11/2017, 12:34 PM

## 2017-04-11 NOTE — Discharge Summary (Addendum)
CLOTHILDE TIPPETTS AVW:[id number] DOB: [birth year] DOA: 04/09/2017  PCP: Patient, No Pcp Per  Admit date: 04/09/2017  Discharge date: 04/11/2017  Admitted From: SNF   Disposition:  SNF   Recommendations for Outpatient Follow-up:   Follow up with PCP in 1-2 weeks  PCP Please obtain BMP/CBC, 2 view CXR in 1week,  (see Discharge instructions)   PCP Please follow up on the following pending results: None   Home Health: Hospice   Equipment/Devices: None  Consultations: None Discharge Condition: Fair   CODE STATUS: DNR   Diet Recommendation: Dysphagia 1 diet with feeding assistance and aspiration precautions   Chief Complaint  Patient presents with  . Fall     Brief history of present illness from the day of admission and additional interim summary    Hailey Taylor a 83 y.o.femalewith medical history significant for hypothyroidism, Alzheimer's dementia, hypertension,hospice patient,. Patient has significant dementia so history could not be elicited from patient. History obtained from chart review and EDP. Pt was brought to the ED, from SNF, with reports that patient fell during transfer to a wheel chair, but pt daughter reports she was told pt was found on the floor.Patient sustained a large C shaped laceration to her head, ~15cm in total length, with significant bleeding that persisted throughout the day, despite initial staples earlier in the day, and pressure dressings, which had to be resutured later in the evening.                                                                  Hospital Course    1. Mechanical fall at SNF with large laceration to the left frontal scalp and extensive facial bruising. X-ray of the left knee and pelvis appear nonacute.  He was treated with supportive care, overall stable,  currently monitor her scalp wound closely, see discharge instructions below for staple removal.  2. Possible pneumonia.clinically cannot definitively rule in or rule out -treated here with empiric antibiotics cultures so far negative, 3 more days of oral Levaquin upon discharge.  3. Advanced Alzheimer's dementia. Remains at risk for delirium.minimize benzodiazepines and narcotics, Haldol when necessary as needed. Has rivastigmine patch which will be continued.  Continue fall and aspiration precautions at SNF.  Case discussed with SNF hospice staff at bedside today, they were visiting the hospital.  Patient will be transferred back to SNF with hospice follow-up.  Goal of care comfort.  4. Hypothyroidism. Continue home dose Synthroid.  5. Anemia. Anemia of chronic disease along with some acute blood loss related anemia from the scalp laceration -her hemoglobin stayed stable around 7.5, continue oral iron supplementation, no need for transfusion at this time.     Discharge diagnosis     Principal Problem:   Fall Active Problems:   Hypertension  Memory loss   Hypothyroidism   Normocytic anemia   Leukocytosis   Protein-calorie malnutrition, severe   Laceration of head   Pressure injury of skin   Laceration of forehead    Discharge instructions    Discharge Instructions    Discharge instructions    Complete by:  As directed    Patient will need constant attention for the next several days to ensure she does not remove her bandage. She needs her dressing changed twice a day at least and should be checked immediately if it is soiled. She has 20 staples in her forehead that will need to be removed in about 7-10 days. Please watch for signs of infection. Please follow up with her doctor this week.   Follow with Primary MD in 7 days   Get CBC, CMP,   checked  By  SNF MD in 5-7 days  Activity: As tolerated with Full fall precautions use walker/cane & assistance as  needed  Disposition SNF  Diet:   DIET - DYS 1 with feeding assistance and aspiration precautions.  For Heart failure patients - Check your Weight same time everyday, if you gain over 2 pounds, or you develop in leg swelling, experience more shortness of breath or chest pain, call your Primary MD immediately. Follow Cardiac Low Salt Diet and 1.5 lit/day fluid restriction.  On your next visit with your primary care physician please Get Medicines reviewed and adjusted.  Please request your Prim.MD to go over all Hospital Tests and Procedure/Radiological results at the follow up, please get all Hospital records sent to your Prim MD by signing hospital release before you go home.  If you experience worsening of your admission symptoms, develop shortness of breath, life threatening emergency, suicidal or homicidal thoughts you must seek medical attention immediately by calling 911 or calling your MD immediately  if symptoms less severe.  You Must read complete instructions/literature along with all the possible adverse reactions/side effects for all the Medicines you take and that have been prescribed to you. Take any new Medicines after you have completely understood and accpet all the possible adverse reactions/side effects.   Increase activity slowly    Complete by:  As directed       Discharge Medications   Allergies as of 04/11/2017      Reactions   Lisinopril Other (See Comments)   Causes acute renal failure      Medication List    STOP taking these medications   cephALEXin 500 MG capsule Commonly known as:  KEFLEX     TAKE these medications   acetaminophen 160 MG/5ML elixir Commonly known as:  TYLENOL Take 325 mg by mouth 3 (three) times daily.   aspirin EC 325 MG tablet Take 1 tablet (325 mg total) by mouth daily with breakfast. What changed:  Another medication with the same name was removed. Continue taking this medication, and follow the directions you see here.    DECUBI-VITE PO Take 1 tablet by mouth daily.   feeding supplement (ENSURE ENLIVE) Liqd Take 237 mLs by mouth 3 (three) times daily between meals.   ferrous sulfate 325 (65 FE) MG tablet Take 1 tablet (325 mg total) by mouth 2 (two) times daily with a meal.   hydrochlorothiazide 25 MG tablet Commonly known as:  HYDRODIURIL Take 1 tablet (25 mg total) by mouth daily as needed (fluid retention, leg swelling).   HYDROcodone-acetaminophen 5-325 MG tablet Commonly known as:  NORCO Take 1 tablet by mouth every 8 (  eight) hours as needed for moderate pain.   levofloxacin 500 MG tablet Commonly known as:  LEVAQUIN Take 0.5 tablets (250 mg total) by mouth daily.   levothyroxine 50 MCG tablet Commonly known as:  SYNTHROID, LEVOTHROID TAKE 1 TABLET BY MOUTH DAILY BEFORE BREAKFAST   LORazepam 0.5 MG tablet Commonly known as:  ATIVAN Take 0.5 mg by mouth every 4 (four) hours as needed for anxiety.   methocarbamol 500 MG tablet Commonly known as:  ROBAXIN Take 1 tablet (500 mg total) by mouth every 8 (eight) hours as needed for muscle spasms.   mupirocin ointment 2 % Commonly known as:  BACTROBAN Apply 1 application topically 4 (four) times daily.   polyethylene glycol packet Commonly known as:  MIRALAX / GLYCOLAX Take 17 g by mouth daily as needed for mild constipation.   Potassium Bicarbonate 99 MG Caps Take 99 mEq by mouth 1 day or 1 dose.   rivastigmine 9.5 mg/24hr Commonly known as:  EXELON PLACE 1 PATCH (9.5 MG TOTAL) ONTO THE SKIN DAILY   Vitamin D3 5000 units Caps Take 1 capsule by mouth daily.       Contact information for after-discharge care    Destination    [facility] .   Specialty:  Skilled Nursing Facility Contact information: [address] [place] [address] [phone]              Major procedures and Radiology Reports - PLEASE review detailed and final reports thoroughly  -       Dg Pelvis 1-2  Views  Result Date: 04/09/2017 CLINICAL DATA:  Status post fall, with concern for pelvic injury. Initial encounter. EXAM: PELVIS - 1-2 VIEW COMPARISON:  None. FINDINGS: There is no evidence of acute fracture or dislocation. There is chronic deformity of the left superior and inferior pubic rami. The patient's left hip hemiarthroplasty is unremarkable. A right femoral intramedullary nail and screw are noted. There is no evidence of loosening of hardware. The sacroiliac joints are grossly unremarkable. The visualized bowel gas pattern is grossly unremarkable in appearance. IMPRESSION: 1. No evidence of fracture or dislocation. 2. Bilateral femoral hardware appears grossly intact, without evidence of loosening. Electronically Signed   By: Roanna Raider M.D.   On: 04/09/2017 22:56   Ct Head Wo Contrast  Result Date: 04/09/2017 CLINICAL DATA:  Head trauma.  Head trauma, minor. EXAM: CT HEAD WITHOUT CONTRAST CT CERVICAL SPINE WITHOUT CONTRAST TECHNIQUE: Multidetector CT imaging of the head and cervical spine was performed following the standard protocol without intravenous contrast. Multiplanar CT image reconstructions of the cervical spine were also generated. COMPARISON:  08/18/2016 head CT FINDINGS: CT HEAD FINDINGS Brain: No evidence of acute infarction, hemorrhage, hydrocephalus, extra-axial collection or mass lesion/mass effect. Atrophy, particularly notable in the medial temporal lobes. Patient has history of dementia and pattern suggests Alzheimer's disease. Chronic small vessel ischemia with confluent gliosis and remote lacunar/perforator infarcts. Vascular: Atherosclerotic calcification.  No hyperdense vessel. Skull: Stable forehead laceration. No underlying fracture; there is a subjacent contusion. Sinuses/Orbits: No evidence of injury. CT CERVICAL SPINE FINDINGS Alignment: No traumatic malalignment. Skull base and vertebrae: Negative for fracture Soft tissues and spinal canal: No prevertebral fluid or  swelling. No visible canal hematoma. Disc levels:  No evidence of degenerative impingement. Upper chest: Partly seen layering pleural effusions. IMPRESSION: 1. No evidence of intracranial or cervical spine injury. 2. Forehead contusion and laceration without fracture. 3. Atrophy in keeping with history of dementia. Chronic small vessel ischemia. 4.  Layering pleural effusions. Electronically Signed   By: Marnee SpringJonathon  Watts M.D.   On: 04/09/2017 14:22   Ct Cervical Spine Wo Contrast  Result Date: 04/09/2017 CLINICAL DATA:  Head trauma.  Head trauma, minor. EXAM: CT HEAD WITHOUT CONTRAST CT CERVICAL SPINE WITHOUT CONTRAST TECHNIQUE: Multidetector CT imaging of the head and cervical spine was performed following the standard protocol without intravenous contrast. Multiplanar CT image reconstructions of the cervical spine were also generated. COMPARISON:  08/18/2016 head CT FINDINGS: CT HEAD FINDINGS Brain: No evidence of acute infarction, hemorrhage, hydrocephalus, extra-axial collection or mass lesion/mass effect. Atrophy, particularly notable in the medial temporal lobes. Patient has history of dementia and pattern suggests Alzheimer's disease. Chronic small vessel ischemia with confluent gliosis and remote lacunar/perforator infarcts. Vascular: Atherosclerotic calcification.  No hyperdense vessel. Skull: Stable forehead laceration. No underlying fracture; there is a subjacent contusion. Sinuses/Orbits: No evidence of injury. CT CERVICAL SPINE FINDINGS Alignment: No traumatic malalignment. Skull base and vertebrae: Negative for fracture Soft tissues and spinal canal: No prevertebral fluid or swelling. No visible canal hematoma. Disc levels:  No evidence of degenerative impingement. Upper chest: Partly seen layering pleural effusions. IMPRESSION: 1. No evidence of intracranial or cervical spine injury. 2. Forehead contusion and laceration without fracture. 3. Atrophy in keeping with history of dementia. Chronic small  vessel ischemia. 4. Layering pleural effusions. Electronically Signed   By: Marnee SpringJonathon  Watts M.D.   On: 04/09/2017 14:22   Dg Chest Port 1 View  Result Date: 04/10/2017 CLINICAL DATA:  SOb today EXAM: PORTABLE CHEST 1 VIEW COMPARISON:  04/09/2017 FINDINGS: Heart size is accentuated by the technique. Suspect cardiomegaly. Tortuous aorta. There are bibasilar opacities obscuring the hemidiaphragms. Bilateral pleural effusions are present. IMPRESSION: Persistent bilateral pleural effusions and bibasilar atelectasis or infiltrates. Electronically Signed   By: Norva PavlovElizabeth  Brown M.D.   On: 04/10/2017 08:25   Dg Chest Port 1 View  Result Date: 04/09/2017 CLINICAL DATA:  Acute onset of shortness of breath. Initial encounter. EXAM: PORTABLE CHEST 1 VIEW COMPARISON:  Chest radiograph performed 08/18/2016 FINDINGS: The lungs are well-aerated. Mild vascular congestion is noted. Mild bibasilar airspace opacities may reflect pneumonia or mild interstitial edema. A small right pleural effusion is noted. No pneumothorax is seen. The lung apices are partially obscured by the patient's head. The cardiomediastinal silhouette is borderline enlarged. No acute osseous abnormalities are seen. There is chronic deformity of the right humeral head. IMPRESSION: Mild vascular congestion and borderline cardiomegaly. Mild bibasilar airspace opacities may reflect pneumonia or mild interstitial edema. Small right pleural effusion noted. Electronically Signed   By: Roanna RaiderJeffery  Chang M.D.   On: 04/09/2017 22:57   Dg Knee 3 Views Left  Result Date: 04/09/2017 CLINICAL DATA:  Status post fall, with left knee bruising. Initial encounter. EXAM: LEFT KNEE - 3 VIEW COMPARISON:  None. FINDINGS: There is no evidence of fracture or dislocation. The joint spaces are preserved. No significant degenerative change is seen; the patellofemoral joint is grossly unremarkable in appearance. No significant joint effusion is seen. Scattered vascular  calcifications are seen. IMPRESSION: 1. No evidence of fracture or dislocation. 2. Scattered vascular calcifications seen. Electronically Signed   By: Roanna RaiderJeffery  Chang M.D.   On: 04/09/2017 22:57    Micro Results     Recent Results (from the past 240 hour(s))  MRSA PCR Screening     Status: None   Collection Time: 04/10/17 12:24 AM  Result Value Ref Range Status   MRSA by PCR NEGATIVE NEGATIVE Final    Comment:  The GeneXpert MRSA Assay (FDA approved for NASAL specimens only), is one component of a comprehensive MRSA colonization surveillance program. It is not intended to diagnose MRSA infection nor to guide or monitor treatment for MRSA infections.     Today   Subjective    [name] today remains confused and unreliable historian but denies headache chest or abdominal pain   Objective   Blood pressure (!) 134/50, pulse 60, temperature 98 F (36.7 C), temperature source Oral, resp. rate 18, height 5\' 5"  (1.651 m), weight 50.3 kg (110 lb 12.8 oz), SpO2 91 %.   Intake/Output Summary (Last 24 hours) at 04/11/17 1235 Last data filed at 04/11/17 1100  Gross per 24 hour  Intake           966.67 ml  Output               50 ml  Net           916.67 ml    Exam  Awake , but confused, moving all 4 extremities, large laceration on the left frontal scalp with large bruises around both eyeballs Supple Neck,No JVD, No cervical lymphadenopathy appriciated.  Symmetrical Chest wall movement, Good air movement bilaterally, CTAB RRR,No Gallops,Rubs or new Murmurs, No Parasternal Heave +ve B.Sounds, Abd Soft, No tenderness, No organomegaly appriciated, No rebound - guarding or rigidity. No Cyanosis, Clubbing or edema, No new Rash or bruise    Data Review   CBC w Diff:  Lab Results  Component Value Date   WBC 8.2 04/11/2017   HGB 7.6 (L) 04/11/2017   HGB 12.2 05/21/2016   HCT 23.6 (L) 04/11/2017   HCT 36.0 05/21/2016   PLT 158 04/11/2017   PLT 322 05/21/2016    LYMPHOPCT 4 04/09/2017   MONOPCT 5 04/09/2017   EOSPCT 0 04/09/2017   BASOPCT 0 04/09/2017    CMP:  Lab Results  Component Value Date   NA 141 04/11/2017   NA 140 05/21/2016   K 4.0 04/11/2017   CL 106 04/11/2017   CO2 24 04/11/2017   BUN 34 (H) 04/11/2017   BUN 25 05/21/2016   CREATININE 1.02 (H) 04/11/2017   CREATININE 0.94 (H) 01/09/2016   PROT 6.9 01/09/2016   ALBUMIN 3.9 01/09/2016   BILITOT 0.4 01/09/2016   ALKPHOS 76 01/09/2016   AST 16 01/09/2016   ALT 9 01/09/2016  .   Total Time in preparing paper work, data evaluation and todays exam - 35 minutes  Susa Raring M.D on 04/11/2017 at 12:35 PM  [facility]   Office  [phone]

## 2017-04-11 NOTE — Progress Notes (Signed)
Patient discharged [facility], [facility].

## 2017-04-11 NOTE — Clinical Social Work Note (Signed)
Clinical Social Work Assessment  Patient Details  Name: Hailey Taylor MRN: [id number] Date of Birth: 09/10/1921  Date of referral:  04/11/17               Reason for consult:  Facility Placement                Permission sought to share information with:  Facility Industrial/product designerContact Representative Permission granted to share information::  Yes, Verbal Permission Granted  Name::        Agency::  [facility]  Relationship::     Contact Information:     Housing/Transportation Living arrangements for the past 2 months:  Assisted Living Facility Source of Information:  Facility Patient Interpreter Needed:  None Criminal Activity/Legal Involvement Pertinent to Current Situation/Hospitalization:  No - Comment as needed Significant Relationships:  Adult Children Lives with:  Facility Resident Do you feel safe going back to the place where you live?  Yes Need for family participation in patient care:  Yes (Comment)  Care giving concerns:  [facility] and had [facility]. Pt will return to ALF. Pt dc today. CSW will assist with transition back to snf.  CSW called SNF and left message. Pt appears to be a long term resident with ALF.   CSW spoke to daughter who is in agreement with mom returning to SNF. She confirmed that patient a long term resident and hospice is following.  CSW will continue to follow-up  Social Worker assessment / plan:  CSW will assist with transition back to SNF. CSW will continue to follow-up for transition.  Employment status:  Retired Health and safety inspectornsurance information:  Medicare PT Recommendations:  Skilled Nursing Facility Information / Referral to community resources:  Skilled Nursing Facility  Patient/Family's Response to care:  Family appreciative of CSW assistance. No issues or concerns identified.  Patient/Family's Understanding of and Emotional Response to Diagnosis, Current Treatment, and Prognosis:  Family has good understanding of diagnosis, current treatment and  prognosis. Family hopeful that patient will return to SNF and continue with care needs. Hospice following patient. No issues or concerns identified.  Emotional Assessment Appearance:  Appears stated age Attitude/Demeanor/Rapport:  Lethargic Affect (typically observed):  Unable to Assess Orientation:   (Disoriented) Alcohol / Substance use:  Not Applicable Psych involvement (Current and /or in the community):  No (Comment)  Discharge Needs  Concerns to be addressed:  Care Coordination Readmission within the last 30 days:  No Current discharge risk:  Physical Impairment, Dependent with Mobility Barriers to Discharge:  No Barriers Identified   Tresa Mooreatricia V Michaele Amundson, LCSW 04/11/2017, 10:29 AM

## 2017-04-12 DIAGNOSIS — L89613 Pressure ulcer of right heel, stage 3: Secondary | ICD-10-CM | POA: Diagnosis not present

## 2017-04-12 DIAGNOSIS — I70235 Atherosclerosis of native arteries of right leg with ulceration of other part of foot: Secondary | ICD-10-CM | POA: Diagnosis not present

## 2017-04-15 DIAGNOSIS — R32 Unspecified urinary incontinence: Secondary | ICD-10-CM | POA: Diagnosis not present

## 2017-04-15 DIAGNOSIS — I10 Essential (primary) hypertension: Secondary | ICD-10-CM | POA: Diagnosis not present

## 2017-04-15 DIAGNOSIS — W19XXXA Unspecified fall, initial encounter: Secondary | ICD-10-CM | POA: Diagnosis not present

## 2017-04-15 DIAGNOSIS — S0990XA Unspecified injury of head, initial encounter: Secondary | ICD-10-CM | POA: Diagnosis not present

## 2017-04-15 DIAGNOSIS — G309 Alzheimer's disease, unspecified: Secondary | ICD-10-CM | POA: Diagnosis not present

## 2017-04-15 DIAGNOSIS — J189 Pneumonia, unspecified organism: Secondary | ICD-10-CM | POA: Diagnosis not present

## 2017-04-15 DIAGNOSIS — E039 Hypothyroidism, unspecified: Secondary | ICD-10-CM | POA: Diagnosis not present

## 2017-04-16 DIAGNOSIS — I10 Essential (primary) hypertension: Secondary | ICD-10-CM | POA: Diagnosis not present

## 2017-04-16 DIAGNOSIS — G309 Alzheimer's disease, unspecified: Secondary | ICD-10-CM | POA: Diagnosis not present

## 2017-04-16 DIAGNOSIS — E039 Hypothyroidism, unspecified: Secondary | ICD-10-CM | POA: Diagnosis not present

## 2017-04-19 DIAGNOSIS — G309 Alzheimer's disease, unspecified: Secondary | ICD-10-CM | POA: Diagnosis not present

## 2017-04-19 DIAGNOSIS — I10 Essential (primary) hypertension: Secondary | ICD-10-CM | POA: Diagnosis not present

## 2017-04-19 DIAGNOSIS — E039 Hypothyroidism, unspecified: Secondary | ICD-10-CM | POA: Diagnosis not present

## 2017-04-20 DIAGNOSIS — Z09 Encounter for follow-up examination after completed treatment for conditions other than malignant neoplasm: Secondary | ICD-10-CM | POA: Diagnosis not present

## 2017-04-20 DIAGNOSIS — J189 Pneumonia, unspecified organism: Secondary | ICD-10-CM | POA: Diagnosis not present

## 2017-04-20 DIAGNOSIS — G309 Alzheimer's disease, unspecified: Secondary | ICD-10-CM | POA: Diagnosis not present

## 2017-04-20 DIAGNOSIS — I10 Essential (primary) hypertension: Secondary | ICD-10-CM | POA: Diagnosis not present

## 2017-04-20 DIAGNOSIS — E039 Hypothyroidism, unspecified: Secondary | ICD-10-CM | POA: Diagnosis not present

## 2017-04-21 DIAGNOSIS — E039 Hypothyroidism, unspecified: Secondary | ICD-10-CM | POA: Diagnosis not present

## 2017-04-21 DIAGNOSIS — G309 Alzheimer's disease, unspecified: Secondary | ICD-10-CM | POA: Diagnosis not present

## 2017-04-21 DIAGNOSIS — I10 Essential (primary) hypertension: Secondary | ICD-10-CM | POA: Diagnosis not present

## 2017-04-22 DIAGNOSIS — I10 Essential (primary) hypertension: Secondary | ICD-10-CM | POA: Diagnosis not present

## 2017-04-22 DIAGNOSIS — E039 Hypothyroidism, unspecified: Secondary | ICD-10-CM | POA: Diagnosis not present

## 2017-04-22 DIAGNOSIS — G309 Alzheimer's disease, unspecified: Secondary | ICD-10-CM | POA: Diagnosis not present

## 2017-04-23 DIAGNOSIS — E039 Hypothyroidism, unspecified: Secondary | ICD-10-CM | POA: Diagnosis not present

## 2017-04-23 DIAGNOSIS — I10 Essential (primary) hypertension: Secondary | ICD-10-CM | POA: Diagnosis not present

## 2017-04-23 DIAGNOSIS — G309 Alzheimer's disease, unspecified: Secondary | ICD-10-CM | POA: Diagnosis not present

## 2017-04-26 DIAGNOSIS — E039 Hypothyroidism, unspecified: Secondary | ICD-10-CM | POA: Diagnosis not present

## 2017-04-26 DIAGNOSIS — I10 Essential (primary) hypertension: Secondary | ICD-10-CM | POA: Diagnosis not present

## 2017-04-26 DIAGNOSIS — G309 Alzheimer's disease, unspecified: Secondary | ICD-10-CM | POA: Diagnosis not present

## 2017-04-27 DIAGNOSIS — G309 Alzheimer's disease, unspecified: Secondary | ICD-10-CM | POA: Diagnosis not present

## 2017-04-27 DIAGNOSIS — I10 Essential (primary) hypertension: Secondary | ICD-10-CM | POA: Diagnosis not present

## 2017-04-27 DIAGNOSIS — E039 Hypothyroidism, unspecified: Secondary | ICD-10-CM | POA: Diagnosis not present

## 2017-04-28 DIAGNOSIS — I10 Essential (primary) hypertension: Secondary | ICD-10-CM | POA: Diagnosis not present

## 2017-04-28 DIAGNOSIS — G309 Alzheimer's disease, unspecified: Secondary | ICD-10-CM | POA: Diagnosis not present

## 2017-04-28 DIAGNOSIS — E039 Hypothyroidism, unspecified: Secondary | ICD-10-CM | POA: Diagnosis not present

## 2017-04-29 DIAGNOSIS — G309 Alzheimer's disease, unspecified: Secondary | ICD-10-CM | POA: Diagnosis not present

## 2017-04-29 DIAGNOSIS — E039 Hypothyroidism, unspecified: Secondary | ICD-10-CM | POA: Diagnosis not present

## 2017-04-29 DIAGNOSIS — I10 Essential (primary) hypertension: Secondary | ICD-10-CM | POA: Diagnosis not present

## 2017-04-30 DIAGNOSIS — I10 Essential (primary) hypertension: Secondary | ICD-10-CM | POA: Diagnosis not present

## 2017-04-30 DIAGNOSIS — E039 Hypothyroidism, unspecified: Secondary | ICD-10-CM | POA: Diagnosis not present

## 2017-04-30 DIAGNOSIS — G309 Alzheimer's disease, unspecified: Secondary | ICD-10-CM | POA: Diagnosis not present

## 2017-05-03 DIAGNOSIS — G309 Alzheimer's disease, unspecified: Secondary | ICD-10-CM | POA: Diagnosis not present

## 2017-05-03 DIAGNOSIS — E039 Hypothyroidism, unspecified: Secondary | ICD-10-CM | POA: Diagnosis not present

## 2017-05-03 DIAGNOSIS — I10 Essential (primary) hypertension: Secondary | ICD-10-CM | POA: Diagnosis not present

## 2017-05-04 DIAGNOSIS — E039 Hypothyroidism, unspecified: Secondary | ICD-10-CM | POA: Diagnosis not present

## 2017-05-04 DIAGNOSIS — G309 Alzheimer's disease, unspecified: Secondary | ICD-10-CM | POA: Diagnosis not present

## 2017-05-04 DIAGNOSIS — I10 Essential (primary) hypertension: Secondary | ICD-10-CM | POA: Diagnosis not present

## 2017-05-07 DIAGNOSIS — G309 Alzheimer's disease, unspecified: Secondary | ICD-10-CM | POA: Diagnosis not present

## 2017-05-07 DIAGNOSIS — E039 Hypothyroidism, unspecified: Secondary | ICD-10-CM | POA: Diagnosis not present

## 2017-05-07 DIAGNOSIS — F0280 Dementia in other diseases classified elsewhere without behavioral disturbance: Secondary | ICD-10-CM | POA: Diagnosis not present

## 2017-05-07 DIAGNOSIS — J189 Pneumonia, unspecified organism: Secondary | ICD-10-CM | POA: Diagnosis not present

## 2017-05-07 DIAGNOSIS — Z7409 Other reduced mobility: Secondary | ICD-10-CM | POA: Diagnosis not present

## 2017-05-07 DIAGNOSIS — R32 Unspecified urinary incontinence: Secondary | ICD-10-CM | POA: Diagnosis not present

## 2017-05-07 DIAGNOSIS — I10 Essential (primary) hypertension: Secondary | ICD-10-CM | POA: Diagnosis not present

## 2017-05-10 DIAGNOSIS — G309 Alzheimer's disease, unspecified: Secondary | ICD-10-CM | POA: Diagnosis not present

## 2017-05-10 DIAGNOSIS — I10 Essential (primary) hypertension: Secondary | ICD-10-CM | POA: Diagnosis not present

## 2017-05-10 DIAGNOSIS — E039 Hypothyroidism, unspecified: Secondary | ICD-10-CM | POA: Diagnosis not present

## 2017-05-11 DIAGNOSIS — I10 Essential (primary) hypertension: Secondary | ICD-10-CM | POA: Diagnosis not present

## 2017-05-11 DIAGNOSIS — G309 Alzheimer's disease, unspecified: Secondary | ICD-10-CM | POA: Diagnosis not present

## 2017-05-11 DIAGNOSIS — E039 Hypothyroidism, unspecified: Secondary | ICD-10-CM | POA: Diagnosis not present

## 2017-05-13 DIAGNOSIS — G309 Alzheimer's disease, unspecified: Secondary | ICD-10-CM | POA: Diagnosis not present

## 2017-05-13 DIAGNOSIS — I10 Essential (primary) hypertension: Secondary | ICD-10-CM | POA: Diagnosis not present

## 2017-05-13 DIAGNOSIS — E039 Hypothyroidism, unspecified: Secondary | ICD-10-CM | POA: Diagnosis not present

## 2017-05-15 DIAGNOSIS — E039 Hypothyroidism, unspecified: Secondary | ICD-10-CM | POA: Diagnosis not present

## 2017-05-15 DIAGNOSIS — I10 Essential (primary) hypertension: Secondary | ICD-10-CM | POA: Diagnosis not present

## 2017-05-15 DIAGNOSIS — G309 Alzheimer's disease, unspecified: Secondary | ICD-10-CM | POA: Diagnosis not present

## 2017-05-17 DIAGNOSIS — G309 Alzheimer's disease, unspecified: Secondary | ICD-10-CM | POA: Diagnosis not present

## 2017-05-17 DIAGNOSIS — E039 Hypothyroidism, unspecified: Secondary | ICD-10-CM | POA: Diagnosis not present

## 2017-05-17 DIAGNOSIS — I10 Essential (primary) hypertension: Secondary | ICD-10-CM | POA: Diagnosis not present

## 2017-05-18 DIAGNOSIS — E039 Hypothyroidism, unspecified: Secondary | ICD-10-CM | POA: Diagnosis not present

## 2017-05-18 DIAGNOSIS — G309 Alzheimer's disease, unspecified: Secondary | ICD-10-CM | POA: Diagnosis not present

## 2017-05-18 DIAGNOSIS — I10 Essential (primary) hypertension: Secondary | ICD-10-CM | POA: Diagnosis not present

## 2017-05-19 DIAGNOSIS — I10 Essential (primary) hypertension: Secondary | ICD-10-CM | POA: Diagnosis not present

## 2017-05-19 DIAGNOSIS — E039 Hypothyroidism, unspecified: Secondary | ICD-10-CM | POA: Diagnosis not present

## 2017-05-19 DIAGNOSIS — G309 Alzheimer's disease, unspecified: Secondary | ICD-10-CM | POA: Diagnosis not present

## 2017-05-20 DIAGNOSIS — I10 Essential (primary) hypertension: Secondary | ICD-10-CM | POA: Diagnosis not present

## 2017-05-20 DIAGNOSIS — G309 Alzheimer's disease, unspecified: Secondary | ICD-10-CM | POA: Diagnosis not present

## 2017-05-20 DIAGNOSIS — E039 Hypothyroidism, unspecified: Secondary | ICD-10-CM | POA: Diagnosis not present

## 2017-05-21 DIAGNOSIS — E039 Hypothyroidism, unspecified: Secondary | ICD-10-CM | POA: Diagnosis not present

## 2017-05-21 DIAGNOSIS — G309 Alzheimer's disease, unspecified: Secondary | ICD-10-CM | POA: Diagnosis not present

## 2017-05-21 DIAGNOSIS — I10 Essential (primary) hypertension: Secondary | ICD-10-CM | POA: Diagnosis not present

## 2017-05-22 DIAGNOSIS — I10 Essential (primary) hypertension: Secondary | ICD-10-CM | POA: Diagnosis not present

## 2017-05-22 DIAGNOSIS — G309 Alzheimer's disease, unspecified: Secondary | ICD-10-CM | POA: Diagnosis not present

## 2017-05-22 DIAGNOSIS — E039 Hypothyroidism, unspecified: Secondary | ICD-10-CM | POA: Diagnosis not present

## 2017-06-15 DEATH — deceased

## 2017-11-18 IMAGING — CT CT HEAD W/O CM
4 of 9 series · 15 of 47 positions shown, 17 images · non-contrast
Comparison: 08/18/2016 head CT

CLINICAL DATA: Head trauma.  Head trauma, minor.

EXAM:
CT HEAD WITHOUT CONTRAST
CT CERVICAL SPINE WITHOUT CONTRAST
TECHNIQUE: Multidetector CT imaging of the head and cervical spine was
performed following the standard protocol without intravenous
contrast. Multiplanar CT image reconstructions of the cervical spine
were also generated.

[Series 7: coronal · coronal · 0.31mm/px · 3 of 68 slices shown]
[im 13/68  brain]
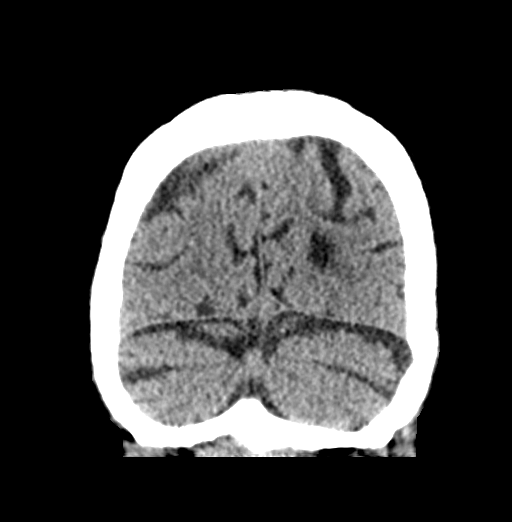
[im 17/68  brain]
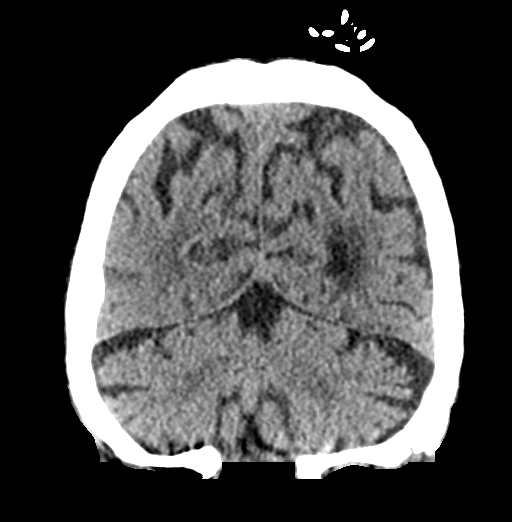
[im 21/68  brain]
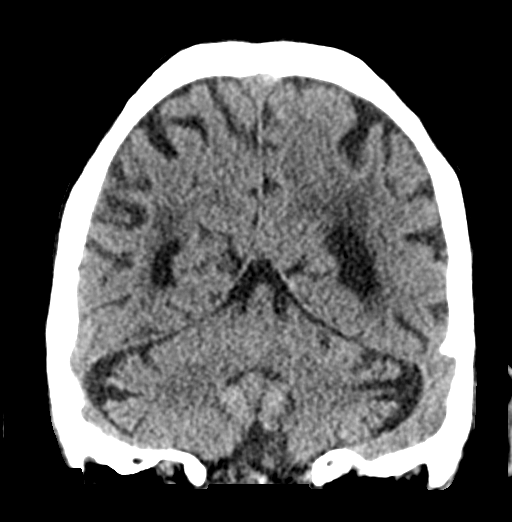

[Series 9: axial recon · axial · 0.31mm/px · z∈[-74,-6]mm · 3 of 54 slices shown (1 of 2)]
[im 14/54  brain]
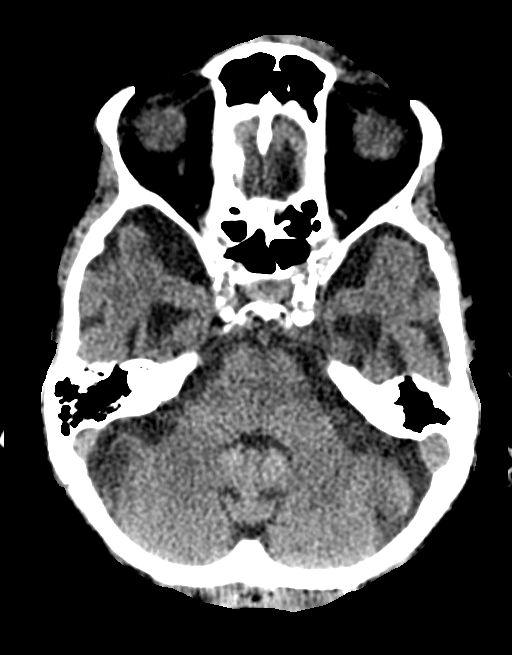
[im 27/54  brain]
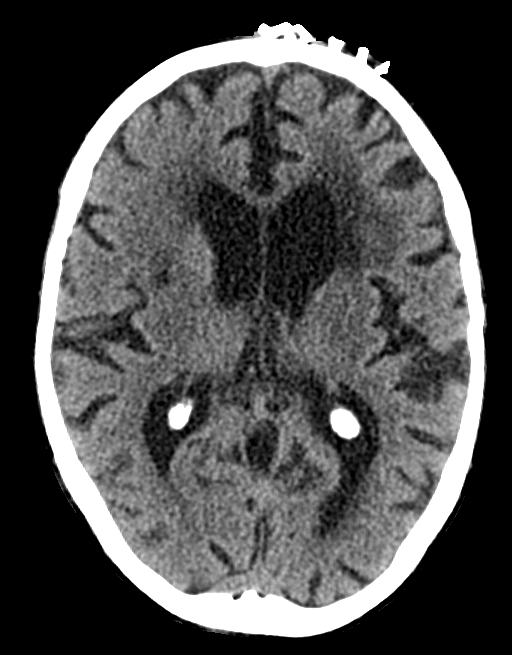
[im 40/54  brain]
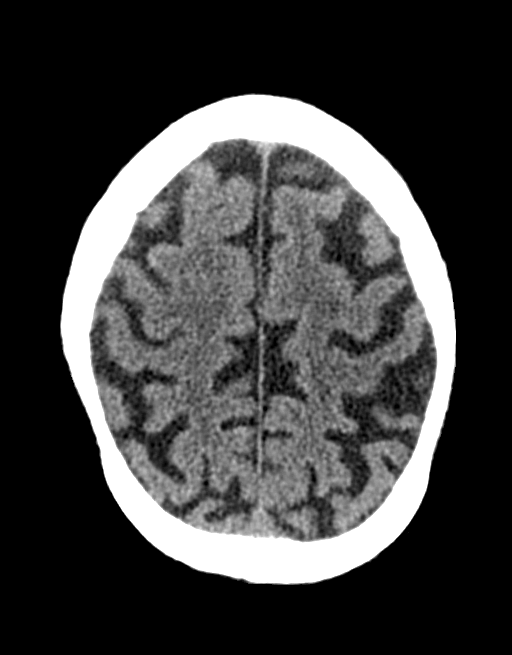

[Series 12: axial recon · axial · 0.23mm/px · z∈[-306,-169]mm · 7 of 103 slices shown, 9 images (2 of 2)]
[im 13/103  brain]
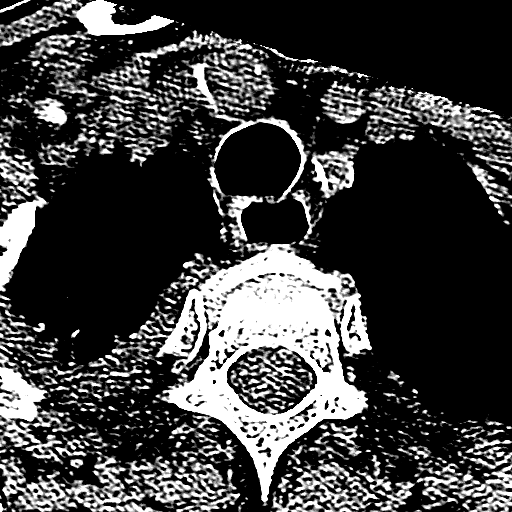
[im 13/103  bone]
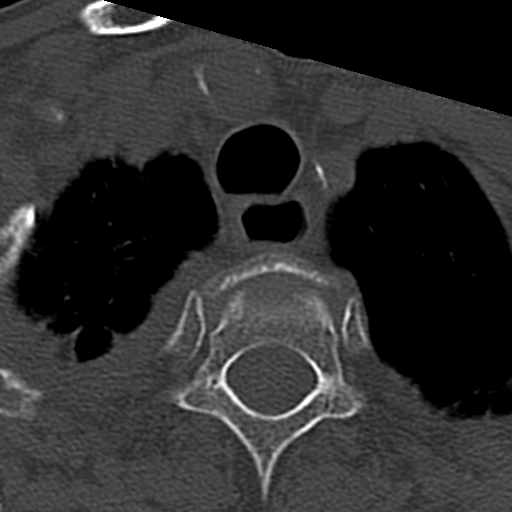
[im 26/103  brain]
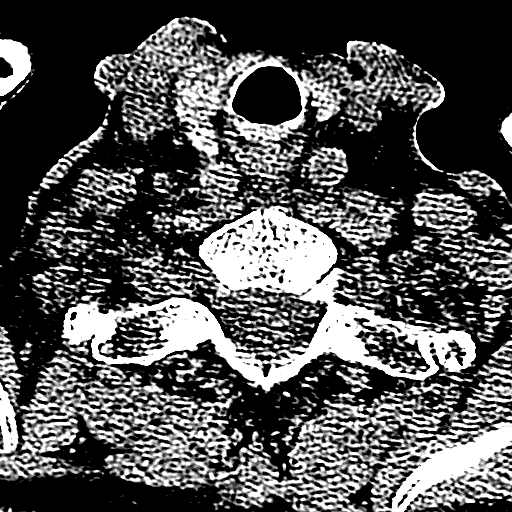
[im 39/103  brain]
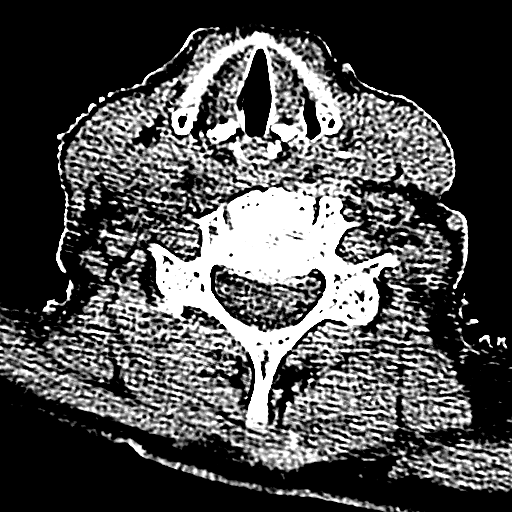
[im 52/103  brain]
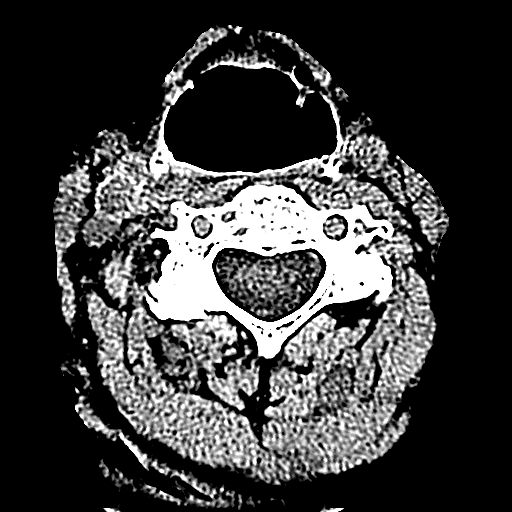
[im 64/103  brain]
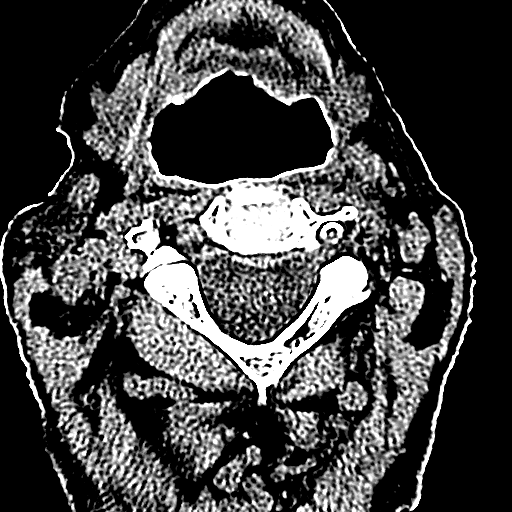
[im 64/103  bone]
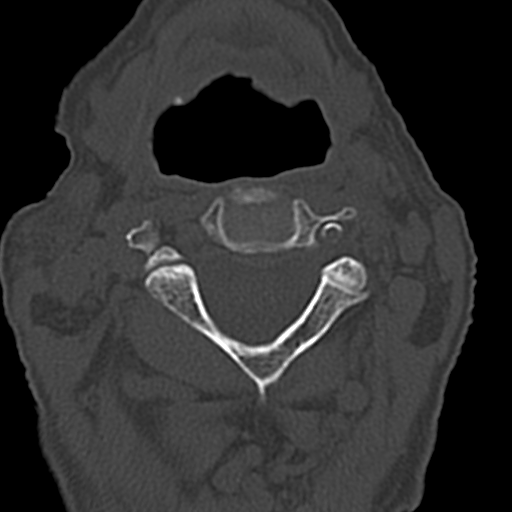
[im 77/103  brain]
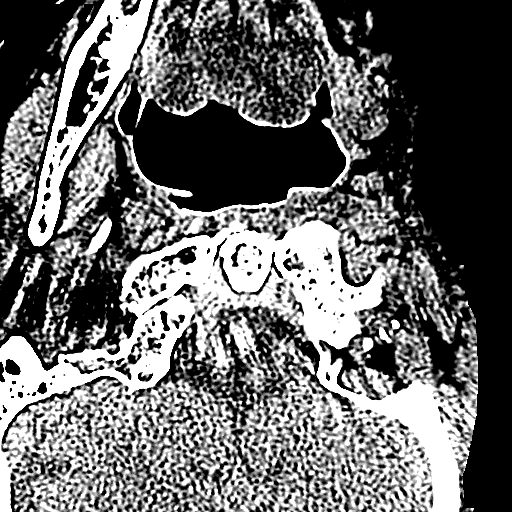
[im 90/103  brain]
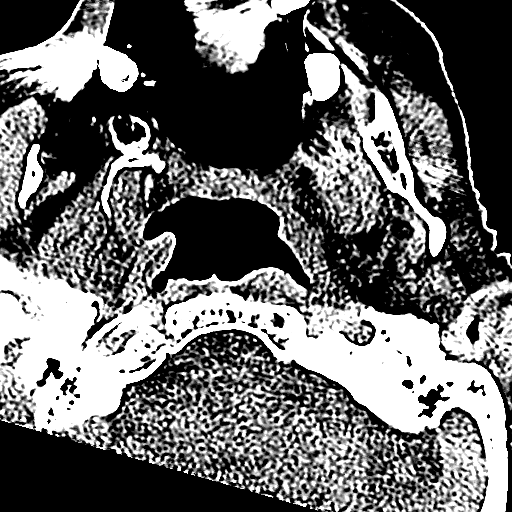

[Series 14: sagittal · sagittal · 0.23mm/px · 2 of 61 slices shown]
[im 21/61  brain]
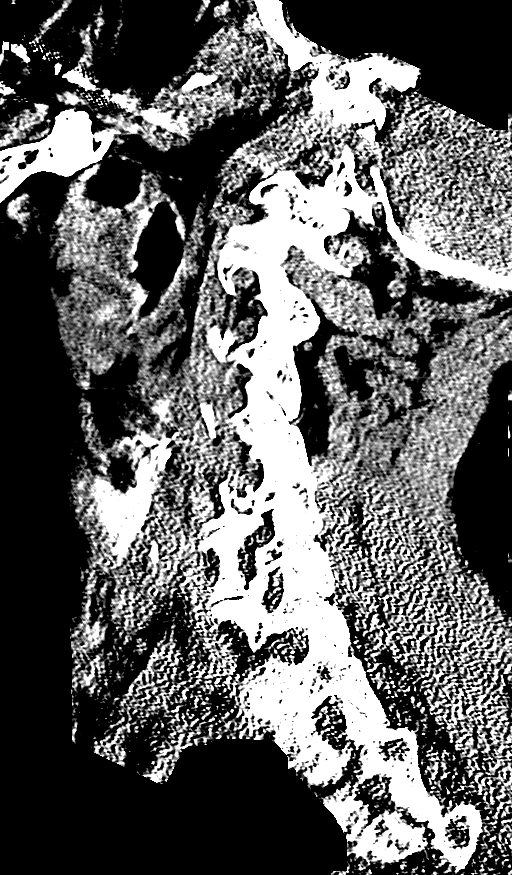
[im 41/61  brain]
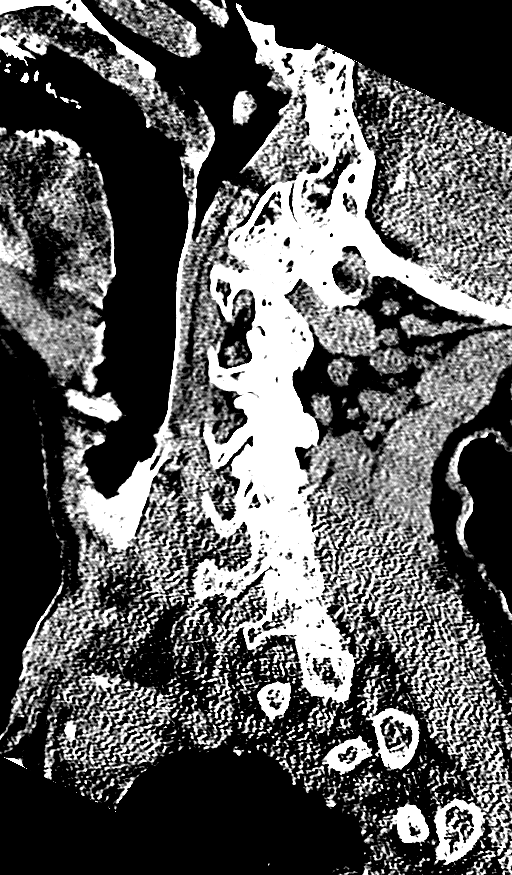

[15 of 47 positions shown; findings below may reference images not displayed]

FINDINGS: CT HEAD FINDINGS

Brain: No evidence of acute infarction, hemorrhage, hydrocephalus,
extra-axial collection or mass lesion/mass effect.

Atrophy, particularly notable in the medial temporal lobes. Patient
has history of dementia and pattern suggests Alzheimer's disease.
Chronic small vessel ischemia with confluent gliosis and remote
lacunar/perforator infarcts.

Vascular: Atherosclerotic calcification.  No hyperdense vessel.

Skull: Stable forehead laceration. No underlying fracture; there is
a subjacent contusion.

Sinuses/Orbits: No evidence of injury.

CT CERVICAL SPINE FINDINGS

Alignment: No traumatic malalignment.

Skull base and vertebrae: Negative for fracture

Soft tissues and spinal canal: No prevertebral fluid or swelling. No
visible canal hematoma.

Disc levels:  No evidence of degenerative impingement.

Upper chest: Partly seen layering pleural effusions.
IMPRESSION: 1. No evidence of intracranial or cervical spine injury.
2. Forehead contusion and laceration without fracture.
3. Atrophy in keeping with history of dementia. Chronic small vessel
ischemia.
4. Layering pleural effusions.
